# Patient Record
Sex: Male | Born: 1935 | Race: White | Hispanic: No | Marital: Married | Smoking: Never smoker
Health system: Southern US, Community
[De-identification: ages and names within clinical notes are randomized; demographics above are authoritative.]

## PROBLEM LIST (undated history)

## (undated) DIAGNOSIS — I10 Essential (primary) hypertension: Secondary | ICD-10-CM

## (undated) DIAGNOSIS — E785 Hyperlipidemia, unspecified: Secondary | ICD-10-CM

## (undated) DIAGNOSIS — E119 Type 2 diabetes mellitus without complications: Secondary | ICD-10-CM

## (undated) HISTORY — PX: CATARACT EXTRACTION: SUR2

## (undated) HISTORY — PX: PROSTATECTOMY: SHX69

---

## 2018-03-21 ENCOUNTER — Other Ambulatory Visit: Payer: Self-pay

## 2018-03-21 ENCOUNTER — Encounter (HOSPITAL_COMMUNITY): Payer: Self-pay | Admitting: Emergency Medicine

## 2018-03-21 ENCOUNTER — Emergency Department (HOSPITAL_COMMUNITY): Payer: Medicare Other

## 2018-03-21 DIAGNOSIS — Z7982 Long term (current) use of aspirin: Secondary | ICD-10-CM

## 2018-03-21 DIAGNOSIS — E785 Hyperlipidemia, unspecified: Secondary | ICD-10-CM | POA: Diagnosis present

## 2018-03-21 DIAGNOSIS — N179 Acute kidney failure, unspecified: Secondary | ICD-10-CM | POA: Diagnosis present

## 2018-03-21 DIAGNOSIS — T675XXA Heat exhaustion, unspecified, initial encounter: Secondary | ICD-10-CM | POA: Diagnosis present

## 2018-03-21 DIAGNOSIS — E860 Dehydration: Secondary | ICD-10-CM | POA: Diagnosis present

## 2018-03-21 DIAGNOSIS — Z7984 Long term (current) use of oral hypoglycemic drugs: Secondary | ICD-10-CM

## 2018-03-21 DIAGNOSIS — I509 Heart failure, unspecified: Secondary | ICD-10-CM | POA: Diagnosis not present

## 2018-03-21 DIAGNOSIS — I110 Hypertensive heart disease with heart failure: Principal | ICD-10-CM | POA: Diagnosis present

## 2018-03-21 DIAGNOSIS — E871 Hypo-osmolality and hyponatremia: Secondary | ICD-10-CM | POA: Diagnosis present

## 2018-03-21 DIAGNOSIS — D638 Anemia in other chronic diseases classified elsewhere: Secondary | ICD-10-CM | POA: Diagnosis present

## 2018-03-21 DIAGNOSIS — I429 Cardiomyopathy, unspecified: Secondary | ICD-10-CM | POA: Diagnosis present

## 2018-03-21 DIAGNOSIS — E1165 Type 2 diabetes mellitus with hyperglycemia: Secondary | ICD-10-CM | POA: Diagnosis present

## 2018-03-21 DIAGNOSIS — I493 Ventricular premature depolarization: Secondary | ICD-10-CM | POA: Diagnosis present

## 2018-03-21 DIAGNOSIS — E876 Hypokalemia: Secondary | ICD-10-CM | POA: Diagnosis present

## 2018-03-21 DIAGNOSIS — R0902 Hypoxemia: Secondary | ICD-10-CM | POA: Diagnosis present

## 2018-03-21 DIAGNOSIS — Z23 Encounter for immunization: Secondary | ICD-10-CM

## 2018-03-21 DIAGNOSIS — I5041 Acute combined systolic (congestive) and diastolic (congestive) heart failure: Secondary | ICD-10-CM | POA: Diagnosis present

## 2018-03-21 DIAGNOSIS — X30XXXA Exposure to excessive natural heat, initial encounter: Secondary | ICD-10-CM

## 2018-03-21 LAB — CBG MONITORING, ED: GLUCOSE-CAPILLARY: 217 mg/dL — AB (ref 70–99)

## 2018-03-21 NOTE — ED Triage Notes (Signed)
Pt c/o dizziness and nausea while camping with the [employer]. Pt states he did not eat lunch or dinner today due to no appetite. Pt states his blood pressure was 150/80.

## 2018-03-22 ENCOUNTER — Emergency Department (HOSPITAL_COMMUNITY): Payer: Medicare Other

## 2018-03-22 ENCOUNTER — Other Ambulatory Visit: Payer: Self-pay

## 2018-03-22 ENCOUNTER — Encounter (HOSPITAL_COMMUNITY): Payer: Self-pay | Admitting: Internal Medicine

## 2018-03-22 ENCOUNTER — Observation Stay (HOSPITAL_BASED_OUTPATIENT_CLINIC_OR_DEPARTMENT_OTHER): Payer: Medicare Other

## 2018-03-22 ENCOUNTER — Inpatient Hospital Stay (HOSPITAL_COMMUNITY)
Admission: EM | Admit: 2018-03-22 | Discharge: 2018-03-24 | DRG: 292 | Disposition: A | Discharge status: Home / Self Care | Payer: Medicare Other | Attending: Internal Medicine | Admitting: Internal Medicine

## 2018-03-22 DIAGNOSIS — T675XXA Heat exhaustion, unspecified, initial encounter: Secondary | ICD-10-CM

## 2018-03-22 DIAGNOSIS — E876 Hypokalemia: Secondary | ICD-10-CM | POA: Diagnosis present

## 2018-03-22 DIAGNOSIS — I10 Essential (primary) hypertension: Secondary | ICD-10-CM | POA: Diagnosis present

## 2018-03-22 DIAGNOSIS — E785 Hyperlipidemia, unspecified: Secondary | ICD-10-CM | POA: Diagnosis present

## 2018-03-22 DIAGNOSIS — I509 Heart failure, unspecified: Secondary | ICD-10-CM | POA: Diagnosis not present

## 2018-03-22 DIAGNOSIS — N289 Disorder of kidney and ureter, unspecified: Secondary | ICD-10-CM

## 2018-03-22 DIAGNOSIS — R0902 Hypoxemia: Secondary | ICD-10-CM

## 2018-03-22 DIAGNOSIS — E860 Dehydration: Secondary | ICD-10-CM

## 2018-03-22 DIAGNOSIS — E119 Type 2 diabetes mellitus without complications: Secondary | ICD-10-CM

## 2018-03-22 DIAGNOSIS — I5021 Acute systolic (congestive) heart failure: Secondary | ICD-10-CM | POA: Diagnosis present

## 2018-03-22 DIAGNOSIS — E871 Hypo-osmolality and hyponatremia: Secondary | ICD-10-CM | POA: Diagnosis present

## 2018-03-22 DIAGNOSIS — D649 Anemia, unspecified: Secondary | ICD-10-CM | POA: Diagnosis present

## 2018-03-22 DIAGNOSIS — I5041 Acute combined systolic (congestive) and diastolic (congestive) heart failure: Secondary | ICD-10-CM | POA: Diagnosis present

## 2018-03-22 HISTORY — DX: Essential (primary) hypertension: I10

## 2018-03-22 HISTORY — DX: Type 2 diabetes mellitus without complications: E11.9

## 2018-03-22 HISTORY — DX: Hyperlipidemia, unspecified: E78.5

## 2018-03-22 LAB — GLUCOSE, CAPILLARY
GLUCOSE-CAPILLARY: 170 mg/dL — AB (ref 70–99)
GLUCOSE-CAPILLARY: 40 mg/dL — AB (ref 70–99)
GLUCOSE-CAPILLARY: 76 mg/dL (ref 70–99)
GLUCOSE-CAPILLARY: 107 mg/dL — AB (ref 70–99)
Glucose-Capillary: 157 mg/dL — ABNORMAL HIGH (ref 70–99)
Glucose-Capillary: 134 mg/dL — ABNORMAL HIGH (ref 70–99)
Glucose-Capillary: 184 mg/dL — ABNORMAL HIGH (ref 70–99)

## 2018-03-22 LAB — CBC WITH DIFFERENTIAL/PLATELET
BASOS ABS: 0 10*3/uL (ref 0.0–0.1)
Basophils Relative: 0 %
Eosinophils Absolute: 0 10*3/uL (ref 0.0–0.7)
Eosinophils Relative: 0 %
HCT: 27.9 % — ABNORMAL LOW (ref 39.0–52.0)
Hemoglobin: 9.1 g/dL — ABNORMAL LOW (ref 13.0–17.0)
LYMPHS PCT: 12 %
Lymphs Abs: 0.8 10*3/uL (ref 0.7–4.0)
MCH: 30 pg (ref 26.0–34.0)
MCHC: 32.6 g/dL (ref 30.0–36.0)
MCV: 92.1 fL (ref 78.0–100.0)
Monocytes Absolute: 0.5 10*3/uL (ref 0.1–1.0)
Monocytes Relative: 8 %
NEUTROS ABS: 5.3 10*3/uL (ref 1.7–7.7)
Neutrophils Relative %: 80 %
PLATELETS: 165 10*3/uL (ref 150–400)
RBC: 3.03 MIL/uL — AB (ref 4.22–5.81)
RDW: 15.4 % (ref 11.5–15.5)
WBC: 6.6 10*3/uL (ref 4.0–10.5)

## 2018-03-22 LAB — URINALYSIS, ROUTINE W REFLEX MICROSCOPIC
Bilirubin Urine: NEGATIVE
GLUCOSE, UA: NEGATIVE mg/dL
Hgb urine dipstick: NEGATIVE
Ketones, ur: NEGATIVE mg/dL
LEUKOCYTES UA: NEGATIVE
NITRITE: NEGATIVE
PROTEIN: NEGATIVE mg/dL
Specific Gravity, Urine: 1.006 (ref 1.005–1.030)
pH: 5 (ref 5.0–8.0)

## 2018-03-22 LAB — BRAIN NATRIURETIC PEPTIDE: B NATRIURETIC PEPTIDE 5: 1794 pg/mL — AB (ref 0.0–100.0)

## 2018-03-22 LAB — IRON AND TIBC
Iron: 74 ug/dL (ref 45–182)
SATURATION RATIOS: 22 % (ref 17.9–39.5)
TIBC: 337 ug/dL (ref 250–450)
UIBC: 263 ug/dL

## 2018-03-22 LAB — POC OCCULT BLOOD, ED: FECAL OCCULT BLD: NEGATIVE

## 2018-03-22 LAB — CK: Total CK: 100 U/L (ref 49–397)

## 2018-03-22 LAB — COMPREHENSIVE METABOLIC PANEL
ALBUMIN: 3.8 g/dL (ref 3.5–5.0)
ALK PHOS: 68 U/L (ref 38–126)
ALT: 24 U/L (ref 0–44)
ANION GAP: 10 (ref 5–15)
AST: 30 U/L (ref 15–41)
BILIRUBIN TOTAL: 1 mg/dL (ref 0.3–1.2)
BUN: 31 mg/dL — ABNORMAL HIGH (ref 8–23)
CHLORIDE: 95 mmol/L — AB (ref 98–111)
CO2: 21 mmol/L — ABNORMAL LOW (ref 22–32)
Calcium: 8.4 mg/dL — ABNORMAL LOW (ref 8.9–10.3)
Creatinine, Ser: 1.75 mg/dL — ABNORMAL HIGH (ref 0.61–1.24)
GFR, EST AFRICAN AMERICAN: 41 mL/min — AB (ref >60)
GFR, EST NON AFRICAN AMERICAN: 35 mL/min — AB (ref >60)
Glucose, Bld: 221 mg/dL — ABNORMAL HIGH (ref 70–99)
POTASSIUM: 5.2 mmol/L — AB (ref 3.5–5.1)
Sodium: 126 mmol/L — ABNORMAL LOW (ref 135–145)
Total Protein: 6.7 g/dL (ref 6.5–8.1)

## 2018-03-22 LAB — FERRITIN: FERRITIN: 85 ng/mL (ref 24–336)

## 2018-03-22 LAB — MAGNESIUM: MAGNESIUM: 1.8 mg/dL (ref 1.7–2.4)

## 2018-03-22 LAB — TROPONIN I
Troponin I: 0.03 ng/mL (ref <0.03)
Troponin I: 0.03 ng/mL (ref <0.03)

## 2018-03-22 LAB — ECHOCARDIOGRAM COMPLETE
HEIGHTINCHES: 71 in
Weight: 3710.78 oz

## 2018-03-22 LAB — VITAMIN B12: Vitamin B-12: 1141 pg/mL — ABNORMAL HIGH (ref 180–914)

## 2018-03-22 LAB — FOLATE: FOLATE: 11.5 ng/mL (ref >5.9)

## 2018-03-22 MED ORDER — VITAMIN C 500 MG PO TABS
500.0000 mg | ORAL_TABLET | Freq: Two times a day (BID) | ORAL | Status: DC
  Administered 2018-03-22 – 2018-03-24 (×5): 500 mg via ORAL
  Filled 2018-03-22 (×5): qty 1

## 2018-03-22 MED ORDER — SODIUM CHLORIDE 0.9 % IV BOLUS
1000.0000 mL | Freq: Once | INTRAVENOUS | Status: AC
  Administered 2018-03-22: 1000 mL via INTRAVENOUS

## 2018-03-22 MED ORDER — ACETAMINOPHEN 325 MG PO TABS: 650.0000 mg | ORAL_TABLET | Freq: Four times a day (QID) | ORAL | Status: DC | PRN

## 2018-03-22 MED ORDER — VITAMIN B-12 1000 MCG PO TABS
1000.0000 ug | ORAL_TABLET | Freq: Two times a day (BID) | ORAL | Status: DC
  Administered 2018-03-22 – 2018-03-24 (×5): 1000 ug via ORAL
  Filled 2018-03-22 (×5): qty 1

## 2018-03-22 MED ORDER — MAGNESIUM SULFATE 2 GM/50ML IV SOLN
2.0000 g | Freq: Once | INTRAVENOUS | Status: AC
  Administered 2018-03-22: 2 g via INTRAVENOUS
  Filled 2018-03-22: qty 50

## 2018-03-22 MED ORDER — ORAL CARE MOUTH RINSE
15.0000 mL | Freq: Two times a day (BID) | OROMUCOSAL | Status: DC
Start: 2018-03-22 — End: 2018-03-24
  Administered 2018-03-22 – 2018-03-23 (×2): 15 mL via OROMUCOSAL

## 2018-03-22 MED ORDER — INSULIN ASPART 100 UNIT/ML ~~LOC~~ SOLN
0.0000 [IU] | Freq: Three times a day (TID) | SUBCUTANEOUS | Status: DC
  Administered 2018-03-22: 3 [IU] via SUBCUTANEOUS
  Administered 2018-03-22: 2 [IU] via SUBCUTANEOUS
  Administered 2018-03-23: 3 [IU] via SUBCUTANEOUS
  Administered 2018-03-24: 2 [IU] via SUBCUTANEOUS
  Administered 2018-03-24: 3 [IU] via SUBCUTANEOUS

## 2018-03-22 MED ORDER — PIOGLITAZONE HCL 15 MG PO TABS
15.0000 mg | ORAL_TABLET | Freq: Two times a day (BID) | ORAL | Status: DC
  Administered 2018-03-22: 15 mg via ORAL
  Filled 2018-03-22 (×4): qty 1

## 2018-03-22 MED ORDER — PNEUMOCOCCAL VAC POLYVALENT 25 MCG/0.5ML IJ INJ
0.5000 mL | INJECTION | INTRAMUSCULAR | Status: AC
  Administered 2018-03-23: 0.5 mL via INTRAMUSCULAR
  Filled 2018-03-22: qty 0.5

## 2018-03-22 MED ORDER — ATENOLOL 25 MG PO TABS
25.0000 mg | ORAL_TABLET | Freq: Every day | ORAL | Status: DC
  Administered 2018-03-22: 25 mg via ORAL
  Filled 2018-03-22 (×2): qty 1

## 2018-03-22 MED ORDER — FUROSEMIDE 10 MG/ML IJ SOLN
60.0000 mg | Freq: Once | INTRAMUSCULAR | Status: AC
  Administered 2018-03-22: 60 mg via INTRAVENOUS
  Filled 2018-03-22: qty 6

## 2018-03-22 MED ORDER — SIMVASTATIN 20 MG PO TABS
40.0000 mg | ORAL_TABLET | Freq: Every day | ORAL | Status: DC
  Administered 2018-03-22 – 2018-03-24 (×3): 40 mg via ORAL
  Filled 2018-03-22 (×3): qty 2

## 2018-03-22 MED ORDER — ONDANSETRON HCL 4 MG PO TABS: 4.0000 mg | ORAL_TABLET | Freq: Four times a day (QID) | ORAL | Status: DC | PRN

## 2018-03-22 MED ORDER — ONDANSETRON HCL 4 MG/2ML IJ SOLN: 4.0000 mg | Freq: Four times a day (QID) | INTRAMUSCULAR | Status: DC | PRN

## 2018-03-22 MED ORDER — ASPIRIN EC 81 MG PO TBEC
81.0000 mg | DELAYED_RELEASE_TABLET | Freq: Every day | ORAL | Status: DC
  Administered 2018-03-22 – 2018-03-24 (×3): 81 mg via ORAL
  Filled 2018-03-22 (×3): qty 1

## 2018-03-22 MED ORDER — METFORMIN HCL 850 MG PO TABS
850.0000 mg | ORAL_TABLET | Freq: Two times a day (BID) | ORAL | Status: DC
  Administered 2018-03-22: 850 mg via ORAL
  Filled 2018-03-22 (×3): qty 1

## 2018-03-22 MED ORDER — ACETAMINOPHEN 650 MG RE SUPP: 650.0000 mg | Freq: Four times a day (QID) | RECTAL | Status: DC | PRN

## 2018-03-22 MED ORDER — FUROSEMIDE 10 MG/ML IJ SOLN
40.0000 mg | Freq: Two times a day (BID) | INTRAMUSCULAR | Status: DC
  Administered 2018-03-22 – 2018-03-23 (×3): 40 mg via INTRAVENOUS
  Filled 2018-03-22 (×3): qty 4

## 2018-03-22 MED ORDER — LISINOPRIL 10 MG PO TABS: 20.0000 mg | ORAL_TABLET | Freq: Every day | ORAL | Status: DC

## 2018-03-22 MED ORDER — PIOGLITAZONE HCL-METFORMIN HCL 15-850 MG PO TABS: 1.0000 | ORAL_TABLET | Freq: Two times a day (BID) | ORAL | Status: DC

## 2018-03-22 MED ORDER — FERROUS SULFATE 325 (65 FE) MG PO TABS
325.0000 mg | ORAL_TABLET | Freq: Two times a day (BID) | ORAL | Status: DC
  Administered 2018-03-22 – 2018-03-24 (×5): 325 mg via ORAL
  Filled 2018-03-22 (×5): qty 1

## 2018-03-22 MED ORDER — GLIPIZIDE 5 MG PO TABS
5.0000 mg | ORAL_TABLET | Freq: Every day | ORAL | Status: DC
  Administered 2018-03-22 – 2018-03-24 (×3): 5 mg via ORAL
  Filled 2018-03-22 (×3): qty 1

## 2018-03-22 MED ORDER — ENOXAPARIN SODIUM 60 MG/0.6ML ~~LOC~~ SOLN
0.5000 mg/kg | SUBCUTANEOUS | Status: DC
  Administered 2018-03-22 – 2018-03-23 (×2): 55 mg via SUBCUTANEOUS
  Filled 2018-03-22 (×2): qty 0.6

## 2018-03-22 NOTE — ED Provider Notes (Signed)
[facility] EMERGENCY DEPARTMENT Provider Note   CSN: [id number] Arrival date & time: 03/21/18  2231  Time seen 01:25 AM   History   Chief Complaint Chief Complaint  Patient presents with  . Dizziness    HPI George Bullock is a 82 y.o. male.  HPI patient is from [place].  He states he was camping with the [place] on July 7 at [place].  They were unloading the truck and he banged his left knee and states it was sore however as the day progressed it kept getting more painful.  He states they did not have air conditioning and he was sweating a lot.  He states yesterday, July 8 he started feeling shortness of breath and having dyspnea on exertion and weakness.  This happened around lunchtime.  He states he felt dizzy and when standing felt like he might pass out.  He states he has been drinking a lot of water and he did drink one bottle Gatorade on the way to the ED.  He states he is having urinary output.  He denies headache, chest pain, or abdominal pain, vomiting, or diarrhea.  He complains of a dry mouth and states he has been having nausea and dry heaves.  He states he is never felt this way before.  PCP Patient, No Pcp Per   Past Medical History:  Diagnosis Date  . Diabetes mellitus without complication ([place])     There are no active problems to display for this patient.   Past Surgical History:  Procedure Laterality Date  . CATARACT EXTRACTION    . PROSTATECTOMY          Home Medications    Patient states he is on several medications but cannot recall what they are, he does state he is on a blood thinner  Prior to Admission medications   Medication Sig Start Date End Date Taking? Authorizing Provider  aspirin EC 81 MG tablet Take 81 mg by mouth daily.   Yes [provider]  atenolol (TENORMIN) 25 MG tablet Take by mouth daily.   Yes [provider]  ferrous sulfate 325 (65 FE) MG EC tablet Take 325 mg by mouth 2 (two) times daily.    Yes [provider]  glipiZIDE (GLUCOTROL) 5 MG tablet Take by mouth daily before breakfast.   Yes [provider]  lisinopril (PRINIVIL,ZESTRIL) 20 MG tablet Take 20 mg by mouth daily.   Yes [provider]  Omega-3 Fatty Acids (FISH OIL) 1200 MG CAPS Take by mouth 2 (two) times daily.   Yes [provider]  pioglitazone-metformin (ACTOPLUS MET) 15-850 MG tablet Take 1 tablet by mouth 2 (two) times daily with a meal.   Yes [provider]  simvastatin (ZOCOR) 40 MG tablet Take 40 mg by mouth daily.   Yes [provider]  vitamin B-12 (CYANOCOBALAMIN) 1000 MCG tablet Take 1,000 mcg by mouth 2 (two) times daily.   Yes [provider]  vitamin C (ASCORBIC ACID) 500 MG tablet Take 500 mg by mouth 2 (two) times daily.   Yes [provider]    Family History No family history on file.  Social History Social History   Tobacco Use  . Smoking status: Never Smoker  . Smokeless tobacco: Never Used  Substance Use Topics  . Alcohol use: Not Currently  . Drug use: Not Currently  retired [employer]   Allergies   Patient has no allergy information on record.   Review of  Systems Review of Systems  All other systems reviewed and are negative.    Physical Exam Updated Vital Signs BP (!) 158/73   Pulse 62   Temp 97.9 F (36.6 C) (Oral)   Resp (!) 26   Ht 5' 11"  (1.803 m)   Wt 106.6 kg (235 lb)   SpO2 96%   BMI 32.78 kg/m   Vital signs normal except hypertension   Physical Exam  Constitutional: He is oriented to person, place, and time. He appears well-developed and well-nourished.  Non-toxic appearance. He does not appear ill. No distress.  HENT:  Head: Normocephalic and atraumatic.  Right Ear: External ear normal.  Left Ear: External ear normal.  Nose: Nose normal. No mucosal edema or rhinorrhea.  Mouth/Throat: Mucous membranes are dry. No dental abscesses or uvula swelling.  Eyes: Pupils are equal, round,  and reactive to light. Conjunctivae and EOM are normal.  Neck: Normal range of motion and full passive range of motion without pain. Neck supple.  Cardiovascular: Normal rate, regular rhythm and normal heart sounds. Exam reveals no gallop and no friction rub.  No murmur heard. Pulmonary/Chest: Effort normal and breath sounds normal. No respiratory distress. He has no wheezes. He has no rhonchi. He has no rales. He exhibits no tenderness and no crepitus.  Abdominal: Soft. Normal appearance and bowel sounds are normal. He exhibits no distension. There is no tenderness. There is no rebound and no guarding.  Musculoskeletal: Normal range of motion. He exhibits edema. He exhibits no tenderness.  Moves all extremities well.  Patient has some pitting edema of his lower leg, ankles bilaterally.  There is no obvious trauma noted to his left knee, no abrasions or bruising.  He may have a small amount of swelling present.  Neurological: He is alert and oriented to person, place, and time. He has normal strength. No cranial nerve deficit.  Skin: Skin is warm, dry and intact. No rash noted. No erythema. There is pallor.  Psychiatric: He has a normal mood and affect. His speech is normal and behavior is normal. His mood appears not anxious.  Nursing note and vitals reviewed.    ED Treatments / Results  Labs (all labs ordered are listed, but only abnormal results are displayed) Results for orders placed or performed during the hospital encounter of 03/22/18  Comprehensive metabolic panel  Result Value Ref Range   Sodium 126 (L) 135 - 145 mmol/L   Potassium 5.2 (H) 3.5 - 5.1 mmol/L   Chloride 95 (L) 98 - 111 mmol/L   CO2 21 (L) 22 - 32 mmol/L   Glucose, Bld 221 (H) 70 - 99 mg/dL   BUN 31 (H) 8 - 23 mg/dL   Creatinine, Ser 1.75 (H) 0.61 - 1.24 mg/dL   Calcium 8.4 (L) 8.9 - 10.3 mg/dL   Total Protein 6.7 6.5 - 8.1 g/dL   Albumin 3.8 3.5 - 5.0 g/dL   AST 30 15 - 41 U/L   ALT 24 0 - 44 U/L   Alkaline  Phosphatase 68 38 - 126 U/L   Total Bilirubin 1.0 0.3 - 1.2 mg/dL   GFR calc non Af Amer 35 (L) >60 mL/min   GFR calc Af Amer 41 (L) >60 mL/min   Anion gap 10 5 - 15  Troponin I  Result Value Ref Range   Troponin I <0.03 <0.03 ng/mL  CBC with Differential  Result Value Ref Range   WBC 6.6 4.0 - 10.5 K/uL   RBC 3.03 (L) 4.22 -  5.81 MIL/uL   Hemoglobin 9.1 (L) 13.0 - 17.0 g/dL   HCT 27.9 (L) 39.0 - 52.0 %   MCV 92.1 78.0 - 100.0 fL   MCH 30.0 26.0 - 34.0 pg   MCHC 32.6 30.0 - 36.0 g/dL   RDW 15.4 11.5 - 15.5 %   Platelets 165 150 - 400 K/uL   Neutrophils Relative % 80 %   Neutro Abs 5.3 1.7 - 7.7 K/uL   Lymphocytes Relative 12 %   Lymphs Abs 0.8 0.7 - 4.0 K/uL   Monocytes Relative 8 %   Monocytes Absolute 0.5 0.1 - 1.0 K/uL   Eosinophils Relative 0 %   Eosinophils Absolute 0.0 0.0 - 0.7 K/uL   Basophils Relative 0 %   Basophils Absolute 0.0 0.0 - 0.1 K/uL  CK  Result Value Ref Range   Total CK 100 49 - 397 U/L  Brain natriuretic peptide  Result Value Ref Range   B Natriuretic Peptide 1,794.0 (H) 0.0 - 100.0 pg/mL  Magnesium  Result Value Ref Range   Magnesium 1.8 1.7 - 2.4 mg/dL  CBG monitoring, ED  Result Value Ref Range   Glucose-Capillary 217 (H) 70 - 99 mg/dL  POC occult blood, ED RN will collect  Result Value Ref Range   Fecal Occult Bld NEGATIVE NEGATIVE   Laboratory interpretation all normal except significant anemia although patient states he is never been told he is anemic,Hyperglycemia, hyponatremia, renal insufficiency uncertain if old or new, very elevated BNP   EKG EKG Interpretation  Date/Time:  Tuesday March 22 2018 01:49:00 EDT Ventricular Rate:  60 PR Interval:    QRS Duration: 153 QT Interval:  507 QTC Calculation: 507 R Axis:   2 Text Interpretation:  Sinus rhythm Ventricular premature complex Prolonged PR interval LVH with secondary repolarization abnormality Probable lateral infarct, age indeterminate Prolonged QT interval No old tracing to  compare Confirmed by Rolland Porter [phone]) on 03/22/2018 2:42:16 AM   EKG Interpretation  Date/Time:  Tuesday March 22 2018 02:40:10 EDT Ventricular Rate:  62 PR Interval:    QRS Duration: 121 QT Interval:  477 QTC Calculation: 485 R Axis:   3 Text Interpretation:  Sinus rhythm Multiple ventricular premature complexes LVH with secondary repolarization abnormality Borderline prolonged QT interval No significant change since last tracing 1 hour before Confirmed by Rolland Porter [phone]) on 03/22/2018 3:41:38 AM        Radiology Dg Chest Port 1 View  Result Date: 03/22/2018 CLINICAL DATA:  Sudden onset of shortness of breath.  Hypoxia. EXAM: PORTABLE CHEST 1 VIEW COMPARISON:  None. FINDINGS: Mild cardiomegaly with normal mediastinal contours. Moderate pulmonary edema. No confluent airspace disease, large pleural effusion or pneumothorax. No acute osseous abnormalities. IMPRESSION: Cardiomegaly with pulmonary edema. Electronically Signed   By: Jeb Levering M.D.   On: 03/22/2018 03:44   Dg Knee Complete 4 Views Left  Result Date: 03/21/2018 CLINICAL DATA:  Initial evaluation for acute medial and anterior left knee pain. EXAM: LEFT KNEE - COMPLETE 4+ VIEW COMPARISON:  None. FINDINGS: No acute fracture dislocation. No joint effusion. Mild degenerative osteoarthritic changes, most notable at the patellofemoral articulation. Osteopenia noted. No acute soft tissue abnormality. Vascular calcifications noted posterior to the knee. IMPRESSION: 1. No acute osseous abnormality. 2. 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 Electronically Signed   By: Jeannine Boga M.D.   On: 03/21/2018 23:45    Procedures .Critical Care Performed by: Rolland Porter, MD Authorized by: Rolland Porter, MD   Critical care provider  statement:    Critical care time (minutes):  31   Critical care was necessary to treat or prevent imminent or life-threatening deterioration of the following conditions:  Circulatory  failure   Critical care was time spent personally by me on the following activities:  Discussions with consultants, examination of patient, obtaining history from patient or surrogate, ordering and review of laboratory studies, ordering and review of radiographic studies, pulse oximetry and re-evaluation of patient's condition   (including critical care time)  Medications Ordered in ED Medications  sodium chloride 0.9 % bolus 1,000 mL (0 mLs Intravenous Stopped 03/22/18 0419)  sodium chloride 0.9 % bolus 1,000 mL (0 mLs Intravenous Stopped 03/22/18 0419)  furosemide (LASIX) injection 60 mg (60 mg Intravenous Given 03/22/18 0419)     Initial Impression / Assessment and Plan / ED Course  I have reviewed the triage vital signs and the nursing notes.  Pertinent labs & imaging results that were available during my care of the patient were reviewed by me and considered in my medical decision making (see chart for details).     I talked to the patient I felt like he was suffering some symptoms of heat exhaustion.  He was given IV fluids.  Patient states he is never been told he was anemic and Hemoccult was done which was negative.  He states he is on a unknown blood thinner.  Patient has some hyponatremia and hyperglycemia and renal insufficiency of uncertain age.  It could be new.  Patient was discussed with Dr. Olevia Bowens at 02:28 AM, he states if patient is willing he will admit him.  Recheck at 3:00 patient was reportedly more short of breath per nursing staff and his pulse ox dropped into the 80s and he was placed on oxygen.  Patient does appear to be tachypneic however when I listen to him he has some diminished breath sounds but no wheezing, rhonchi, or rales.  When I reviewed patient's chest x-ray he appears to have some increased pulmonary vascularity and his heart size is enlarged.  He was given Lasix IV despite having a renal insufficiency.  He has some peripheral edema and a BNP was added to his  blood work.   04:00 AM Dr Olevia Bowens, hospitalist, will admit  Recheck at 4:10 AM I talked to patient about his chest x-ray which looks like acute pulmonary edema and his very elevated BNP.  He had been given IV Lasix and has only had about 200 cc urinary output so far.  He states he starting to feel better.  We talked about the need for admission and he is agreeable.   Final Clinical Impressions(s) / ED Diagnoses   Final diagnoses:  Dehydration  Heat exhaustion, initial encounter  Hyponatremia  Renal insufficiency  Anemia, unspecified type  Acute congestive heart failure, unspecified heart failure type [facility])  Hypoxia    Plan admission  Rolland Porter, MD, Barbette Or, MD 03/22/18 [phone]

## 2018-03-22 NOTE — ED Notes (Signed)
POC occult negative  

## 2018-03-22 NOTE — Progress Notes (Signed)
Hypoglycemic Event  CBG: 40   Treatment: 15 GM carbohydrate snack  Symptoms: None  Follow-up CBG: Time:1622 CBG Result 76  Possible Reasons for Event: Unknown  Comments/MD notified: Patient had CBG 40 this afternoon. Notified by nurse tech. Patient given snack. Patient asymptomatic. Notified Dr. Kerry HoughMemon. No new orders received this evening. Earnstine RegalAshley Tylea Hise, RN    Riccardo DubinMcCann, Danaiya Steadman Joy

## 2018-03-22 NOTE — Care Management Note (Signed)
Case Management Note  Patient Details  Name: George Bullock MRN: [id number] Date of Birth: 03/29/1936  Subjective/Objective:                  CHF.   Action/Plan: From home. Independent. [place] Plans to return home. Acutely on oxygen, anticipate weaning to room air.   Expected Discharge Date:    03/23/2018              Expected Discharge Plan:  Home/Self Care  In-House Referral:     Discharge planning Services  CM Consult  Post Acute Care Choice:  NA Choice offered to:  NA  DME Arranged:    DME Agency:     HH Arranged:    HH Agency:     Status of Service:  Completed, signed off  If discussed at [employer] Length of Stay Meetings, dates discussed:    Additional Comments:  Tristian Bouska, Chrystine OilerSharley Diane, RN 03/22/2018, 10:38 AM

## 2018-03-22 NOTE — Care Management Obs Status (Signed)
MEDICARE OBSERVATION STATUS NOTIFICATION   Patient Details  Name: George Bullock MRN: [id number] Date of Birth: 06/12/1936   Medicare Observation Status Notification Given:  Yes    Skyy Mcknight, Chrystine OilerSharley Diane, RN 03/22/2018, 10:37 AM

## 2018-03-22 NOTE — ED Notes (Signed)
EDP in room  

## 2018-03-22 NOTE — Progress Notes (Signed)
*  PRELIMINARY RESULTS* Echocardiogram 2D Echocardiogram has been performed.  Stacey DrainWhite, Nickayla Mcinnis J 03/22/2018, 1:17 PM

## 2018-03-22 NOTE — H&P (Signed)
History and Physical    George Bullock TSV:[id number] DOB: November 11, 1935 DOA: 03/22/2018  PCP: Patient, No Pcp Per   Patient coming from: Home.  I have personally briefly reviewed patient's old medical records in [place]  Chief Complaint: Dizziness and nausea.  HPI: George Bullock is a 82 y.o. male with medical history significant of type 2 diabetes, hyperlipidemia, hypertension who is coming to the emergency department due to dizziness and nausea.  Per patient, he was camping with the [place] at [place].  While unloading a truck, he inadvertently hit his left knee and knee has been getting progressively more painful since then.  He has states that there was no air conditioning and he had profuse sweating.  Yesterday, he started feeling dyspneic, particularly while exerting and weak.  The dizziness has being intensive enough, that he felt that he could have passed out.  He has been drinking water and Gatorade to replace his water losses.  He denies fever, chills, sore throat, wheezing, hemoptysis, chest pain, palpitations, PND, orthopnea or pitting edema of the lower extremities.  He denies abdominal pain, emesis, diarrhea, constipation, melena or hematochezia.  No dysuria, hematuria, frequency or oliguria.  No heat or cold intolerance.  Denies polyuria, polydipsia or polyphagia.  Denies skin rashes.  ED Course: She will vital signs temperature 97.9 F, pulse 62, respiration 70, blood pressure 149/76 mmHg and O2 sat 96% on room air.  The patient was receiving normal saline infusion bolus and after 200 mL have been infused he became very dyspneic.  His lower extremities were very edematous, but the patient stated that he has not noticed this before.  He received furosemide 60 mg IVP and supplemental oxygen reporting relief.    His work-up shows an Unremarkable urinalysis.  CBC showed a white count of 6.6, hemoglobin 9.1 g/dL and platelets 165.Fecal occult blood was negative.  Total CK was  100.  Troponin less than 0.03 ng/mL.  His BNP was 1794.0 pg/mL.  Sodium 126, potassium 5.2, chloride 95 and CO2 21 mmol S/L.  Glucose 221, BUN 31, creatinine 1.75, calcium 8.4 and magnesium 1.8 mg/dL.  His LFTs are within normal limits.  Chest radiograph showed cardiomegaly with pulmonary edema.  Review of Systems: As per HPI otherwise 10 point review of systems negative.    Past Medical History:  Diagnosis Date  . Diabetes mellitus without complication ([place])   . Hyperlipidemia   . Hypertension     Past Surgical History:  Procedure Laterality Date  . CATARACT EXTRACTION    . PROSTATECTOMY       reports that he has never smoked. He has never used smokeless tobacco. He reports that he drank alcohol. He reports that he has current or past drug history.  No Known Allergies  Family History  Problem Relation Age of Onset  . ALS Mother     Prior to Admission medications   Medication Sig Start Date End Date Taking? Authorizing Provider  aspirin EC 81 MG tablet Take 81 mg by mouth daily.   Yes [provider]  atenolol (TENORMIN) 25 MG tablet Take by mouth daily.   Yes [provider]  ferrous sulfate 325 (65 FE) MG EC tablet Take 325 mg by mouth 2 (two) times daily.   Yes [provider]  glipiZIDE (GLUCOTROL) 5 MG tablet Take by mouth daily before breakfast.   Yes [provider]  lisinopril (PRINIVIL,ZESTRIL) 20 MG tablet Take 20 mg by mouth daily.   Yes  [provider]  Omega-3 Fatty Acids (FISH OIL) 1200 MG CAPS Take by mouth 2 (two) times daily.   Yes [provider]  pioglitazone-metformin (ACTOPLUS MET) 15-850 MG tablet Take 1 tablet by mouth 2 (two) times daily with a meal.   Yes [provider]  simvastatin (ZOCOR) 40 MG tablet Take 40 mg by mouth daily.   Yes [provider]  vitamin B-12 (CYANOCOBALAMIN) 1000 MCG tablet Take 1,000 mcg by mouth 2 (two) times daily.   Yes [provider]  vitamin C  (ASCORBIC ACID) 500 MG tablet Take 500 mg by mouth 2 (two) times daily.   Yes [provider]    Physical Exam: Vitals:   03/22/18 0400 03/22/18 0405 03/22/18 0430 03/22/18 0500  BP: (!) 152/77  (!) 156/77 (!) 161/84  Pulse:  (!) 57 (!) 58 60  Resp: (!) 21 (!) 25 (!) 25 19  Temp:      TempSrc:      SpO2:  100% 100% 100%  Weight:      Height:        Constitutional: NAD, calm, comfortable Eyes: PERRL, lids and conjunctivae normal ENMT: Mucous membranes are moist. Posterior pharynx clear of any exudate or lesions.Normal dentition.  Neck: normal, supple, no masses, no thyromegaly Respiratory: Bibasilar crackles.  No wheezing.  Normal respiratory effort. No accessory muscle use.  Cardiovascular: Regular rate and rhythm, multiple extrasystoles, no murmurs / rubs / gallops.  3+ lower extremities edema. 2+ pedal pulses. No carotid bruits.  Abdomen: Obese, soft, no tenderness, no masses palpated. No hepatosplenomegaly. Bowel sounds positive.  Musculoskeletal: no clubbing / cyanosis.  Mild edema and tenderness of left knee. Good left knee passive ROM, no contractures. Normal muscle tone.  Skin: no rashes, lesions, ulcers noted dermatological exam. Neurologic: CN 2-12 grossly intact. Sensation intact, DTR normal. Strength 5/5 in all 4.  Psychiatric: Normal judgment and insight. Alert and oriented x 3. Normal mood.    Labs on Admission: I have personally reviewed following labs and imaging studies  CBC: Recent Labs  Lab 03/22/18 0101  WBC 6.6  NEUTROABS 5.3  HGB 9.1*  HCT 27.9*  MCV 92.1  PLT 235   Basic Metabolic Panel: Recent Labs  Lab 03/22/18 0101  NA 126*  K 5.2*  CL 95*  CO2 21*  GLUCOSE 221*  BUN 31*  CREATININE 1.75*  CALCIUM 8.4*  MG 1.8   GFR: Estimated Creatinine Clearance: 42.5 mL/min (A) (by C-G formula based on SCr of 1.75 mg/dL (H)). Liver Function Tests: Recent Labs  Lab 03/22/18 0101  AST 30  ALT 24  ALKPHOS 68  BILITOT 1.0  PROT 6.7    ALBUMIN 3.8   No results for input(s): LIPASE, AMYLASE in the last 168 hours. No results for input(s): AMMONIA in the last 168 hours. Coagulation Profile: No results for input(s): INR, PROTIME in the last 168 hours. Cardiac Enzymes: Recent Labs  Lab 03/22/18 0101  CKTOTAL 100  TROPONINI <0.03   BNP (last 3 results) No results for input(s): PROBNP in the last 8760 hours. HbA1C: No results for input(s): HGBA1C in the last 72 hours. CBG: Recent Labs  Lab 03/21/18 2240 03/22/18 0555  GLUCAP 217* 170*   Lipid Profile: No results for input(s): CHOL, HDL, LDLCALC, TRIG, CHOLHDL, LDLDIRECT in the last 72 hours. Thyroid Function Tests: No results for input(s): TSH, T4TOTAL, FREET4, T3FREE, THYROIDAB in the last 72 hours. Anemia Panel: No results for input(s): VITAMINB12, FOLATE, FERRITIN, TIBC, IRON, RETICCTPCT in  the last 72 hours. Urine analysis:    Component Value Date/Time   COLORURINE STRAW (A) 03/22/2018 0530   APPEARANCEUR CLEAR 03/22/2018 0530   LABSPEC 1.006 03/22/2018 0530   PHURINE 5.0 03/22/2018 0530   GLUCOSEU NEGATIVE 03/22/2018 0530   HGBUR NEGATIVE 03/22/2018 0530   BILIRUBINUR NEGATIVE 03/22/2018 0530   KETONESUR NEGATIVE 03/22/2018 0530   PROTEINUR NEGATIVE 03/22/2018 0530   NITRITE NEGATIVE 03/22/2018 0530   LEUKOCYTESUR NEGATIVE 03/22/2018 0530    Radiological Exams on Admission: Dg Chest Port 1 View  Result Date: 03/22/2018 CLINICAL DATA:  Sudden onset of shortness of breath.  Hypoxia. EXAM: PORTABLE CHEST 1 VIEW COMPARISON:  None. FINDINGS: Mild cardiomegaly with normal mediastinal contours. Moderate pulmonary edema. No confluent airspace disease, large pleural effusion or pneumothorax. No acute osseous abnormalities. IMPRESSION: Cardiomegaly with pulmonary edema. Electronically Signed   By: Jeb Levering M.D.   On: 03/22/2018 03:44   Dg Knee Complete 4 Views Left  Result Date: 03/21/2018 CLINICAL DATA:  Initial evaluation for acute medial and  anterior left knee pain. EXAM: LEFT KNEE - COMPLETE 4+ VIEW COMPARISON:  None. FINDINGS: No acute fracture dislocation. No joint effusion. Mild degenerative osteoarthritic changes, most notable at the patellofemoral articulation. Osteopenia noted. No acute soft tissue abnormality. Vascular calcifications noted posterior to the knee. IMPRESSION: 1. No acute osseous abnormality. 2. 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 Electronically Signed   By: Jeannine Boga M.D.   On: 03/21/2018 23:45    EKG: Independently reviewed. Sinus rhythm Multiple ventricular premature complexes LVH with secondary repolarization abnormality Borderline prolonged QT interval  Assessment/Plan Principal Problem:   Acute systolic congestive heart failure (HCC) Observation/telemetry. Continue supplemental oxygen. Furosemide 40 mg IVP twice a day. Daily weights. Monitor intake and output. Check echocardiogram. Consult cardiology.  Active Problems:   Hyponatremia Secondary to hypervolemic state. Continue loop diuretic. Follow-up sodium level.    Hypokalemia With ACE inhibitor. Follow-up potassium level.    Renal insufficiency Decreased perfusion due to acute CHF? Diabetic renal disease?  Lisinopril? I will hold ACE inhibitor for now. Continue furosemide as above. Monitor renal function and electrolytes.    Hyperlipidemia Simvastatin 40 mg p.o. daily. Monitor LFTs as needed.    Diabetes mellitus without complication (HCC) Carb modified diet. Continue glipizide 5 mg p.o. daily. CBG monitoring with regular insulin sliding scale.    Hypertension Hold ACE inhibitor. Resume beta-blocker later today, since the patient is doing better clinically.    Anemia Monitor hematocrit and hemoglobin. Check anemia panel.    DVT prophylaxis: Lovenox SQ. Code Status: Full code. Family Communication: Disposition Plan: Observation for CHF exacerbation evaluation and further  treatment. Consults called: Routine cardiology consult. Admission status:/Telemetry/observation.   Reubin Milan MD [facility] Pager [phone].  If 7PM-7AM, please contact night-coverage [email or web address] Password [email or web address]  03/22/2018, 6:02 AM

## 2018-03-22 NOTE — ED Notes (Signed)
Jonathon ResidesJohn Sly [phone] [facility] Center[facility] Roman [phone]

## 2018-03-22 NOTE — Progress Notes (Signed)
Patient admitted to the hospital earlier this morning by Dr. Robb Matarrtiz.  Patient seen and examined.  Reports that shortness of breath is mildly better since admission.  He does not notice a difference in his lower extremity edema.  No chest pain.  On exam, he has crackles at bases bilaterally.  1-2+ edema noted bilaterally.  Patient admitted to the hospital with shortness of breath and lower extremity edema.  BNP is markedly elevated.  He is being treated for decompensated CHF.  He is responding well to intravenous Lasix.  Echocardiogram has been done with report pending.  Continue current treatments.  Darden RestaurantsJehanzeb Wetzel Bullock

## 2018-03-22 NOTE — ED Notes (Signed)
Called into pt room by visitors saying that patient has sudden onset SOB. Pt says he is unable to take a deep breath. o2 sat 89% on room air.  2l [place] placed as a result o2 sat increased to 96%. EDP notified. Repeat EKG performed and given to EDP.

## 2018-03-23 DIAGNOSIS — Z7982 Long term (current) use of aspirin: Secondary | ICD-10-CM | POA: Diagnosis not present

## 2018-03-23 DIAGNOSIS — E1165 Type 2 diabetes mellitus with hyperglycemia: Secondary | ICD-10-CM | POA: Diagnosis present

## 2018-03-23 DIAGNOSIS — X30XXXA Exposure to excessive natural heat, initial encounter: Secondary | ICD-10-CM | POA: Diagnosis not present

## 2018-03-23 DIAGNOSIS — I509 Heart failure, unspecified: Secondary | ICD-10-CM | POA: Diagnosis present

## 2018-03-23 DIAGNOSIS — I493 Ventricular premature depolarization: Secondary | ICD-10-CM | POA: Diagnosis present

## 2018-03-23 DIAGNOSIS — I5021 Acute systolic (congestive) heart failure: Secondary | ICD-10-CM | POA: Diagnosis present

## 2018-03-23 DIAGNOSIS — I110 Hypertensive heart disease with heart failure: Secondary | ICD-10-CM | POA: Diagnosis present

## 2018-03-23 DIAGNOSIS — E860 Dehydration: Secondary | ICD-10-CM | POA: Diagnosis present

## 2018-03-23 DIAGNOSIS — I10 Essential (primary) hypertension: Secondary | ICD-10-CM | POA: Diagnosis not present

## 2018-03-23 DIAGNOSIS — E785 Hyperlipidemia, unspecified: Secondary | ICD-10-CM | POA: Diagnosis present

## 2018-03-23 DIAGNOSIS — T675XXA Heat exhaustion, unspecified, initial encounter: Secondary | ICD-10-CM | POA: Diagnosis present

## 2018-03-23 DIAGNOSIS — D649 Anemia, unspecified: Secondary | ICD-10-CM

## 2018-03-23 DIAGNOSIS — E871 Hypo-osmolality and hyponatremia: Secondary | ICD-10-CM

## 2018-03-23 DIAGNOSIS — N179 Acute kidney failure, unspecified: Secondary | ICD-10-CM | POA: Diagnosis present

## 2018-03-23 DIAGNOSIS — I5041 Acute combined systolic (congestive) and diastolic (congestive) heart failure: Secondary | ICD-10-CM | POA: Diagnosis present

## 2018-03-23 DIAGNOSIS — E876 Hypokalemia: Secondary | ICD-10-CM | POA: Diagnosis present

## 2018-03-23 DIAGNOSIS — Z23 Encounter for immunization: Secondary | ICD-10-CM | POA: Diagnosis present

## 2018-03-23 DIAGNOSIS — I429 Cardiomyopathy, unspecified: Secondary | ICD-10-CM | POA: Diagnosis present

## 2018-03-23 DIAGNOSIS — E119 Type 2 diabetes mellitus without complications: Secondary | ICD-10-CM | POA: Diagnosis not present

## 2018-03-23 DIAGNOSIS — R0902 Hypoxemia: Secondary | ICD-10-CM | POA: Diagnosis present

## 2018-03-23 DIAGNOSIS — Z7984 Long term (current) use of oral hypoglycemic drugs: Secondary | ICD-10-CM | POA: Diagnosis not present

## 2018-03-23 DIAGNOSIS — D638 Anemia in other chronic diseases classified elsewhere: Secondary | ICD-10-CM | POA: Diagnosis present

## 2018-03-23 LAB — CBC
HEMATOCRIT: 27 % — AB (ref 39.0–52.0)
HEMOGLOBIN: 8.8 g/dL — AB (ref 13.0–17.0)
MCH: 29.9 pg (ref 26.0–34.0)
MCHC: 32.6 g/dL (ref 30.0–36.0)
MCV: 91.8 fL (ref 78.0–100.0)
Platelets: 164 10*3/uL (ref 150–400)
RBC: 2.94 MIL/uL — ABNORMAL LOW (ref 4.22–5.81)
RDW: 15.5 % (ref 11.5–15.5)
WBC: 4.5 10*3/uL (ref 4.0–10.5)

## 2018-03-23 LAB — GLUCOSE, CAPILLARY
GLUCOSE-CAPILLARY: 102 mg/dL — AB (ref 70–99)
GLUCOSE-CAPILLARY: 189 mg/dL — AB (ref 70–99)
Glucose-Capillary: 100 mg/dL — ABNORMAL HIGH (ref 70–99)
Glucose-Capillary: 84 mg/dL (ref 70–99)

## 2018-03-23 LAB — BASIC METABOLIC PANEL
ANION GAP: 9 (ref 5–15)
BUN: 32 mg/dL — ABNORMAL HIGH (ref 8–23)
CO2: 27 mmol/L (ref 22–32)
Calcium: 8.6 mg/dL — ABNORMAL LOW (ref 8.9–10.3)
Chloride: 99 mmol/L (ref 98–111)
Creatinine, Ser: 1.52 mg/dL — ABNORMAL HIGH (ref 0.61–1.24)
GFR calc Af Amer: 48 mL/min — ABNORMAL LOW (ref >60)
GFR calc non Af Amer: 42 mL/min — ABNORMAL LOW (ref >60)
Glucose, Bld: 90 mg/dL (ref 70–99)
POTASSIUM: 4.1 mmol/L (ref 3.5–5.1)
Sodium: 135 mmol/L (ref 135–145)

## 2018-03-23 LAB — RETICULOCYTES
RBC.: 2.92 MIL/uL — ABNORMAL LOW (ref 4.22–5.81)
Retic Count, Absolute: 46.7 10*3/uL (ref 19.0–186.0)
Retic Ct Pct: 1.6 % (ref 0.4–3.1)

## 2018-03-23 LAB — TSH: TSH: 1.579 u[IU]/mL (ref 0.350–4.500)

## 2018-03-23 MED ORDER — ENOXAPARIN SODIUM 60 MG/0.6ML ~~LOC~~ SOLN
50.0000 mg | SUBCUTANEOUS | Status: DC
  Administered 2018-03-24: 50 mg via SUBCUTANEOUS
  Filled 2018-03-23: qty 0.6

## 2018-03-23 MED ORDER — METOPROLOL SUCCINATE ER 25 MG PO TB24
12.5000 mg | ORAL_TABLET | Freq: Every day | ORAL | Status: DC
  Administered 2018-03-23 – 2018-03-24 (×2): 12.5 mg via ORAL
  Filled 2018-03-23 (×2): qty 1

## 2018-03-23 NOTE — Progress Notes (Signed)
**Note De-identified Marquavius Scaife Obfuscation** EKG complete RN notified of results and placed in patient chart 

## 2018-03-23 NOTE — Progress Notes (Signed)
PROGRESS NOTE    George Bullock  WUJ:[id number] DOB: 06/27/1936 DOA: 03/22/2018 PCP: Patient, No Pcp Per    Brief Narrative:  [age] year old male presents to the hospital with dizziness and nausea.  Found to have significant peripheral edema and chest x-ray indicated decompensated CHF.  He initially received IV fluids in the emergency room, but became very dyspneic.  He is been started on intravenous diuretics.  Cardiology following.   Assessment & Plan:   Principal Problem:   Acute systolic congestive heart failure (HCC) Active Problems:   Hyperlipidemia   Diabetes mellitus without complication (HCC)   Hypertension   Anemia   Hyponatremia   Hypokalemia   Renal insufficiency   Acute combined systolic and diastolic heart failure (HCC)   Acute systolic (congestive) heart failure (HCC)   1. Acute combined CHF.  Ejection fraction of 40 to 45%.  Currently on intravenous diuretics.  He is had aggressive diuresis.  Overall volume status is improving.  Net volume status since admission is -9.5 L.  Cardiology following.  He is on beta-blockers.  Not a candidate for ACE inhibitors due to renal dysfunction.   2. Hypokalemia.  Improved with replacement 3. Hyponatremia.  Secondary to hypervolemia.  Improved with diuresis. 4. Acute kidney injury.  Related to low output.  Renal function improving with diuresis.  Continue to monitor for now 5. Diabetes.  Currently on glipizide and sliding scale insulin.  Blood sugars stable. 6. Hyperlipidemia.  Continue statin 7. Normocytic anemia.  Anemia panel indicates chronic disease.  No evidence of bleeding at this time.  This can be further worked up as an outpatient.  Hemoglobin appears to be stable.   DVT prophylaxis: Lovenox Code Status: Full code Family Communication: No family present Disposition Plan: Discharge home once improved   Consultants:   Cardiology  Procedures:  Echo:- Left ventricle: The cavity size was normal. Wall thickness was  increased in a pattern of mild LVH. Systolic function was mildly   to moderately reduced. The estimated ejection fraction was in the   range of 40% to 45%. Diffuse hypokinesis. Features are consistent   with a pseudonormal left ventricular filling pattern, with   concomitant abnormal relaxation and increased filling pressure   (grade 2 diastolic dysfunction). Doppler parameters are   consistent with high ventricular filling pressure. - Aortic valve: Valve area (VTI): 1.9 cm^2. Valve area (Vmax): 2.28   cm^2. Valve area (Vmean): 2.03 cm^2. - Mitral valve: Mildly to moderately calcified annulus. Mildly   thickened leaflets . - Left atrium: The atrium was moderately dilated. - Right atrium: The atrium was mildly dilated. - Atrial septum: No defect or patent foramen ovale was identified. - Pulmonary arteries: Systolic pressure was moderately increased.   PA peak pressure: 50 mm Hg (S).  - Technically adequate study.  Antimicrobials:      Subjective: He is feeling better today.  Feels shortness of breath is improving.  No chest pain.  Objective: Vitals:   03/23/18 0559 03/23/18 0600 03/23/18 1211 03/23/18 1445  BP: (!) 119/58   (S) (!) 120/42  Pulse: (!) 53   61  Resp: 18   18  Temp: 98.2 F (36.8 C)   98.2 F (36.8 C)  TempSrc: Oral   Oral  SpO2: 100%  97% 97%  Weight:  99.4 kg (219 lb 2.2 oz)    Height:        Intake/Output Summary (Last 24 hours) at 03/23/2018 1853 Last data filed at 03/23/2018 1756 Gross per 24  hour  Intake 960 ml  Output 7750 ml  Net -6790 ml   Filed Weights   03/21/18 2240 03/22/18 0603 03/23/18 0600  Weight: 106.6 kg (235 lb) 105.2 kg (231 lb 14.8 oz) 99.4 kg (219 lb 2.2 oz)    Examination:  General exam: Appears calm and comfortable  Respiratory system: Crackles at bases. Respiratory effort normal. Cardiovascular system: S1 & S2 heard, RRR. No JVD, murmurs, rubs, gallops or clicks.  1+ pedal edema. Gastrointestinal system: Abdomen is  nondistended, soft and nontender. No organomegaly or masses felt. Normal bowel sounds heard. Central nervous system: Alert and oriented. No focal neurological deficits. Extremities: Symmetric 5 x 5 power. Skin: No rashes, lesions or ulcers Psychiatry: Judgement and insight appear normal. Mood & affect appropriate.     Data Reviewed: I have personally reviewed following labs and imaging studies  CBC: Recent Labs  Lab 03/22/18 0101 03/23/18 0429  WBC 6.6 4.5  NEUTROABS 5.3  --   HGB 9.1* 8.8*  HCT 27.9* 27.0*  MCV 92.1 91.8  PLT 165 164   Basic Metabolic Panel: Recent Labs  Lab 03/22/18 0101 03/23/18 0429  NA 126* 135  K 5.2* 4.1  CL 95* 99  CO2 21* 27  GLUCOSE 221* 90  BUN 31* 32*  CREATININE 1.75* 1.52*  CALCIUM 8.4* 8.6*  MG 1.8  --    GFR: Estimated Creatinine Clearance: 47.3 mL/min (A) (by C-G formula based on SCr of 1.52 mg/dL (H)). Liver Function Tests: Recent Labs  Lab 03/22/18 0101  AST 30  ALT 24  ALKPHOS 68  BILITOT 1.0  PROT 6.7  ALBUMIN 3.8   No results for input(s): LIPASE, AMYLASE in the last 168 hours. No results for input(s): AMMONIA in the last 168 hours. Coagulation Profile: No results for input(s): INR, PROTIME in the last 168 hours. Cardiac Enzymes: Recent Labs  Lab 03/22/18 0101 03/22/18 0629 03/22/18 1319  CKTOTAL 100  --   --   TROPONINI <0.03 0.03* 0.03*   BNP (last 3 results) No results for input(s): PROBNP in the last 8760 hours. HbA1C: No results for input(s): HGBA1C in the last 72 hours. CBG: Recent Labs  Lab 03/22/18 2143 03/23/18 0014 03/23/18 0735 03/23/18 1117 03/23/18 1610  GLUCAP 107* 84 102* 189* 100*   Lipid Profile: No results for input(s): CHOL, HDL, LDLCALC, TRIG, CHOLHDL, LDLDIRECT in the last 72 hours. Thyroid Function Tests: Recent Labs    03/22/18 0429  TSH 1.579   Anemia Panel: Recent Labs    03/22/18 0629 03/23/18 0429  VITAMINB12 1,141*  --   FOLATE 11.5  --   FERRITIN 85  --     TIBC 337  --   IRON 74  --   RETICCTPCT  --  1.6   Sepsis Labs: No results for input(s): PROCALCITON, LATICACIDVEN in the last 168 hours.  No results found for this or any previous visit (from the past 240 hour(s)).       Radiology Studies: Dg Chest Port 1 View  Result Date: 03/22/2018 CLINICAL DATA:  Sudden onset of shortness of breath.  Hypoxia. EXAM: PORTABLE CHEST 1 VIEW COMPARISON:  None. FINDINGS: Mild cardiomegaly with normal mediastinal contours. Moderate pulmonary edema. No confluent airspace disease, large pleural effusion or pneumothorax. No acute osseous abnormalities. IMPRESSION: Cardiomegaly with pulmonary edema. Electronically Signed   By: Rubye Oaks M.D.   On: 03/22/2018 03:44   Dg Knee Complete 4 Views Left  Result Date: 03/21/2018 CLINICAL DATA:  Initial evaluation for acute  medial and anterior left knee pain. EXAM: LEFT KNEE - COMPLETE 4+ VIEW COMPARISON:  None. FINDINGS: No acute fracture dislocation. No joint effusion. Mild degenerative osteoarthritic changes, most notable at the patellofemoral articulation. Osteopenia noted. No acute soft tissue abnormality. Vascular calcifications noted posterior to the knee. IMPRESSION: 1. No acute osseous abnormality. 2. 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 Electronically Signed   By: Rise Mu M.D.   On: 03/21/2018 23:45        Scheduled Meds: . aspirin EC  81 mg Oral Daily  . [START ON 03/24/2018] enoxaparin (LOVENOX) injection  50 mg Subcutaneous Q24H  . ferrous sulfate  325 mg Oral BID  . glipiZIDE  5 mg Oral QAC breakfast  . insulin aspart  0-15 Units Subcutaneous TID WC  . mouth rinse  15 mL Mouth Rinse BID  . metoprolol succinate  12.5 mg Oral Daily  . simvastatin  40 mg Oral Daily  . vitamin B-12  1,000 mcg Oral BID  . vitamin C  500 mg Oral BID   Continuous Infusions:   LOS: 0 days    Time spent: 30 minutes    Erick Blinks, MD [facility] Pager  [phone]  If 7PM-7AM, please contact night-coverage [email or web address] Password [facility] 03/23/2018, 6:53 PM

## 2018-03-23 NOTE — Consult Note (Addendum)
Cardiology Consult    Patient ID: George Bullock; [id number]; [birth year]   Admit date: 03/22/2018 Date of Consult: 03/23/2018  Primary Care Provider: Patient, No Pcp Per Primary Cardiologist: New to [facility] - Dr. Harl Bowie (Patient is from [place], [place])  Patient Profile    George Bullock is a 82 y.o. male with past medical history of Type 2 DM, HTN, and HLD who is being seen today for the evaluation of acute systolic CHF/ new cardiomyopathy at the request of Dr. Roderic Palau.   History of Present Illness    [name] presented to [facility] ED on 03/21/2018 for evaluation of dizziness, nausea, and worsening dyspnea over the past several days. He is here in [place] with a [employer] camp and reports having dyspnea and diaphoresis the day prior to admission which he initially thought was due to the lack of air conditioner at the camp. He reports the weeks leading up to his trip, he was able to perform his routine activities without any chest pain or dyspnea on exertion. 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 He lives an active lifestyle and was previously going to the gym 4 to 5 days/week without any anginal symptoms. Denies any recent orthopnea, PND, or palpitations. Does have a history of lower extremity edema. Says him and his wife dine out multiple times per week.   He is unaware of any known history of CAD or prior CHF. Reports having an echocardiogram and stress test 10+ years ago which were normal by his report. No family history of CAD. He denies any tobacco use or recreational drug use.  Initial labs show WBC 6.6, Hgb 9.1, platelets 165, Na+ 126, K+ 5.2, and creatinine 1.75.  Initial troponin less than 0.03 with repeat values flat at 0.03. Mg 1.8. BNP elevated to 1794. CXR showing cardiomegaly with pulmonary edema. EKG showed NSR, HR 60, with PVC's and TWI along inferior and lateral leads (no prior tracings available for comparison).   He was initially started on IV fluids due to  concern for dehydration but developed acute dyspnea with this while in the ED. Therefore he was admitted and started on IV Lasix 40 mg twice daily.  Repeat labs have been obtained this morning and hemoglobin is down to 8.8 and BMET is pending (K+ has resulted to 4.1 but other electrolytes and creatinine are pending). Listed as having an overall output of -5.7L this admission and weight having declined by 12 lbs (231 --> 219 lbs).   An echocardiogram was performed on 03/22/2018 and showed the EF was reduced to 40 to 45%, diffuse HK, grade 2 diastolic dysfunction, mild LVH, and no significant valve abnormalities.   Past Medical History:  Diagnosis Date  . Diabetes mellitus without complication ([place])   . Hyperlipidemia   . Hypertension     Past Surgical History:  Procedure Laterality Date  . CATARACT EXTRACTION    . PROSTATECTOMY       Home Medications:  Prior to Admission medications   Medication Sig Start Date End Date Taking? Authorizing Provider  aspirin EC 81 MG tablet Take 81 mg by mouth daily.   Yes [provider]  atenolol (TENORMIN) 50 MG tablet Take 1 tablet by mouth daily. 01/04/18  Yes [provider]  ferrous sulfate 325 (65 FE) MG EC tablet Take 325 mg by mouth 2 (two) times daily.   Yes [provider]  glipiZIDE (GLUCOTROL) 5 MG tablet Take by mouth  daily before breakfast.   Yes [provider]  lisinopril (PRINIVIL,ZESTRIL) 20 MG tablet Take 20 mg by mouth daily.   Yes [provider]  Omega-3 Fatty Acids (FISH OIL) 1200 MG CAPS Take by mouth 2 (two) times daily.   Yes [provider]  pioglitazone-metformin (ACTOPLUS MET) 15-850 MG tablet Take 1 tablet by mouth 2 (two) times daily with a meal.   Yes [provider]  simvastatin (ZOCOR) 40 MG tablet Take 40 mg by mouth daily.   Yes [provider]  vitamin B-12 (CYANOCOBALAMIN) 1000 MCG tablet Take 1,000 mcg by mouth 2 (two) times daily.   Yes [provider]  vitamin C (ASCORBIC ACID) 500 MG tablet Take 500 mg by mouth 2 (two) times daily.   Yes [provider]    Inpatient Medications: Scheduled Meds: . aspirin EC  81 mg Oral Daily  . atenolol  25 mg Oral Daily  . enoxaparin (LOVENOX) injection  0.5 mg/kg Subcutaneous Q24H  . ferrous sulfate  325 mg Oral BID  . furosemide  40 mg Intravenous BID  . glipiZIDE  5 mg Oral QAC breakfast  . insulin aspart  0-15 Units Subcutaneous TID WC  . mouth rinse  15 mL Mouth Rinse BID  . pneumococcal 23 valent vaccine  0.5 mL Intramuscular Tomorrow-1000  . simvastatin  40 mg Oral Daily  . vitamin B-12  1,000 mcg Oral BID  . vitamin C  500 mg Oral BID   Continuous Infusions:  PRN Meds: acetaminophen **OR** acetaminophen, ondansetron **OR** ondansetron (ZOFRAN) IV  Allergies:   No Known Allergies  Social History:   Social History   Socioeconomic History  . Marital status: Married    Spouse name: Not on file  . Number of children: Not on file  . Years of education: Not on file  . Highest education level: Not on file  Occupational History  . Not on file  Social Needs  . Financial resource strain: Not on file  . Food insecurity:    Worry: Not on file    Inability: Not on file  . Transportation needs:    Medical: Not on file    Non-medical: Not on file  Tobacco Use  . Smoking status: Never Smoker  . Smokeless tobacco: Never Used  Substance and Sexual Activity  . Alcohol use: Not Currently  . Drug use: Not Currently  . Sexual activity: Not on file  Lifestyle  . Physical activity:    Days per week: Not on file    Minutes per session: Not on file  . Stress: Not on file  Relationships  . Social connections:    Talks on phone: Not on file    Gets together: Not on file    Attends religious service: Not on file    Active member of club or organization: Not on file    Attends meetings of clubs or organizations: Not on file    Relationship status: Not on file  .  Intimate partner violence:    Fear of current or ex partner: Not on file    Emotionally abused: Not on file    Physically abused: Not on file    Forced sexual activity: Not on file  Other Topics Concern  . Not on file  Social History Narrative  . Not on file     Family History:    Family History  Problem Relation Age of Onset  . ALS Mother  Review of Systems    General:  No chills, fever, night sweats or weight changes.  Cardiovascular:  No chest pain, orthopnea, palpitations, paroxysmal nocturnal dyspnea. Positive for dyspnea and edema.  Dermatological: No rash, lesions/masses Respiratory: No cough, dyspnea Urologic: No hematuria, dysuria Abdominal:   No nausea, vomiting, diarrhea, bright red blood per rectum, melena, or hematemesis Neurologic:  No visual changes, wkns, changes in mental status. All other systems reviewed and are otherwise negative except as noted above.  Physical Exam/Data    Vitals:   03/22/18 1924 03/22/18 2148 03/23/18 0559 03/23/18 0600  BP:  (!) 111/42 (!) 119/58   Pulse:  (!) 52 (!) 53   Resp:  18 18   Temp:  98.4 F (36.9 C) 98.2 F (36.8 C)   TempSrc:  Oral Oral   SpO2: 98% 100% 100%   Weight:    219 lb 2.2 oz (99.4 kg)  Height:        Intake/Output Summary (Last 24 hours) at 03/23/2018 0737 Last data filed at 03/23/2018 9528 Gross per 24 hour  Intake 1080 ml  Output 6900 ml  Net -5820 ml   Filed Weights   03/21/18 2240 03/22/18 0603 03/23/18 0600  Weight: 235 lb (106.6 kg) 231 lb 14.8 oz (105.2 kg) 219 lb 2.2 oz (99.4 kg)   Body mass index is 30.56 kg/m.   General: Pleasant, elderly Caucasian male appearing in NAD Psych: Normal affect. Neuro: Alert and oriented X 3. Moves all extremities spontaneously. HEENT: Normal  Neck: Supple without bruits or JVD. Lungs:  Resp regular and unlabored, CTA without wheezing or rales. Heart: RRR no s3, s4, or murmurs. Abdomen: Soft, non-tender, non-distended, BS + x 4.  Extremities: No  clubbing or cyanosis. 2+ pitting edema up to mid-shins bilaterally. DP/PT/Radials 2+ and equal bilaterally.   EKG:  The EKG was personally reviewed and demonstrates: NSR, HR 60, with PVC's and TWI along inferior and lateral leads (no prior tracings available for comparison).    Telemetry:  Telemetry was personally reviewed and demonstrates: Sinus bradycardia, HR in mid-50's to 60's. Occasional PVC's.    Labs/Studies     Relevant CV Studies:  Echocardiogram: 03/22/2018 Study Conclusions  - Left ventricle: The cavity size was normal. Wall thickness was   increased in a pattern of mild LVH. Systolic function was mildly   to moderately reduced. The estimated ejection fraction was in the   range of 40% to 45%. Diffuse hypokinesis. Features are consistent   with a pseudonormal left ventricular filling pattern, with   concomitant abnormal relaxation and increased filling pressure   (grade 2 diastolic dysfunction). Doppler parameters are   consistent with high ventricular filling pressure. - Aortic valve: Valve area (VTI): 1.9 cm^2. Valve area (Vmax): 2.28   cm^2. Valve area (Vmean): 2.03 cm^2. - Mitral valve: Mildly to moderately calcified annulus. Mildly   thickened leaflets . - Left atrium: The atrium was moderately dilated. - Right atrium: The atrium was mildly dilated. - Atrial septum: No defect or patent foramen ovale was identified. - Pulmonary arteries: Systolic pressure was moderately increased.   PA peak pressure: 50 mm Hg (S). - Technically adequate study.  Laboratory Data:  Chemistry Recent Labs  Lab 03/22/18 0101 03/23/18 0429  NA 126* PENDING  K 5.2* 4.1  CL 95* PENDING  CO2 21* 27  GLUCOSE 221* PENDING  BUN 31* PENDING  CREATININE 1.75* PENDING  CALCIUM 8.4* 8.6*  GFRNONAA 35* PENDING  GFRAA 41* PENDING  ANIONGAP 10 PENDING  Recent Labs  Lab 03/22/18 0101  PROT 6.7  ALBUMIN 3.8  AST 30  ALT 24  ALKPHOS 68  BILITOT 1.0   Hematology Recent Labs    Lab 03/22/18 0101 03/23/18 0429  WBC 6.6 4.5  RBC 3.03* 2.94*  2.92*  HGB 9.1* 8.8*  HCT 27.9* 27.0*  MCV 92.1 91.8  MCH 30.0 29.9  MCHC 32.6 32.6  RDW 15.4 15.5  PLT 165 164   Cardiac Enzymes Recent Labs  Lab 03/22/18 0101 03/22/18 0629 03/22/18 1319  TROPONINI <0.03 0.03* 0.03*   No results for input(s): TROPIPOC in the last 168 hours.  BNP Recent Labs  Lab 03/22/18 0101  BNP 1,794.0*    DDimer No results for input(s): DDIMER in the last 168 hours.  Radiology/Studies:  Dg Chest Port 1 View  Result Date: 03/22/2018 CLINICAL DATA:  Sudden onset of shortness of breath.  Hypoxia. EXAM: PORTABLE CHEST 1 VIEW COMPARISON:  None. FINDINGS: Mild cardiomegaly with normal mediastinal contours. Moderate pulmonary edema. No confluent airspace disease, large pleural effusion or pneumothorax. No acute osseous abnormalities. IMPRESSION: Cardiomegaly with pulmonary edema. Electronically Signed   By: Jeb Levering M.D.   On: 03/22/2018 03:44   Dg Knee Complete 4 Views Left  Result Date: 03/21/2018 CLINICAL DATA:  Initial evaluation for acute medial and anterior left knee pain. EXAM: LEFT KNEE - COMPLETE 4+ VIEW COMPARISON:  None. FINDINGS: No acute fracture dislocation. No joint effusion. Mild degenerative osteoarthritic changes, most notable at the patellofemoral articulation. Osteopenia noted. No acute soft tissue abnormality. Vascular calcifications noted posterior to the knee. IMPRESSION: 1. No acute osseous abnormality. 2. 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 Electronically Signed   By: Jeannine Boga M.D.   On: 03/21/2018 23:45     Assessment & Plan    1. Acute Combined Systolic and Diastolic CHF - presented for evaluation of worsening dyspnea, nausea, and dizziness --> found to have an acute CHF exacerbation with BNP elevated to 1794 and CXR showing pulmonary edema. Echocardiogram shows EF is reduced to 40 to 45%, with diffuse HK, grade 2 diastolic  dysfunction, mild LVH, and no significant valve abnormalities. He has been started on IV Lasix '40mg'$  BID and is overall -5.7L and weight has declined by 12 lbs (231 --> 219 lbs). Reports baseline weight in the 220's at home. He still has edema on examination, therefore would continue with IV Lasix at this time and anticipate transitioning to PO later today or early tomorrow morning. - he denies any recent anginal symptoms and has no known history of CAD or CHF. His initial EKG did show T-wave abnormalities in the setting of multiple electrolyte derangements, therefore will obtain a repeat tracing now that these have normalized. Given his AKI and anemia, would not pursue a catheterization at this time. Would focus on medical therapy and plan for a repeat echo in 2-3 months. He lives in [place], [place] and will need to establish care with a Cardiologist in the area. Will stop Atenolol and switch to Toprol-XL 12.'5mg'$  daily (can titrate as HR allows but this has been in the mid-50's). Plan to restart ACE-I once kidney function normalizes.   2. HTN - BP has been well-controlled since admission, at 119/58 on most recent check. - will stop Atenolol and switch to Toprol-XL as outlined above. Lisinopril currently held due to his AKI.  3. HLD - no recent FLP on file. Continue Simvastatin '40mg'$  daily.  4. Type 2 DM - glucose at 102 this AM. Remains  on Glipizide 66m daily.   5. AKI - Creatinine elevated to 1.75 on admission with no prior values available for comparison. Repeat BMET has not yet been resulted this morning. I called the lab and they will follow-up on this as only some results are transferring over.   6. Normocytic Anemia - Hgb 9.1 on admission, at 8.8 this AM. He denies any evidence of active bleeding. Was on iron supplementation prior to admission. - further evaluation per admitting team.     For questions or updates, please contact CRiver BendPlease consult [email or web address] for contact info  under Cardiology/STEMI.  Signed, BErma Heritage PA-C 03/23/2018, 7:37 AM Pager: [phone] Attending note Patient seen and discussed with PA SAhmed Prima I agree with her documentation above. 82yo male history of DM2, HL, HTN admitted with dizziness and nausea and SOB. Has had some LE edema.  In ER treated with IVFs for dehydration with significant worsening of his breathin  Na 126 K 5.2 BUN 31 Cr 1.75 (unknown baseline) WBC 6.6 Hgb 9.1 BNP 1794 Mg 1.8  Trop neg-->0.03-->0.03--> CXR pulm edema EKG SR, inferior and lateral precordial ST depreesions/TWIs probabel LVH strain pattern Echo LVEF 40-45%, grade II diastolic dysfunction, mild LVH. PASP 50. Diffuse hypokinesis   Patient presents with acute combined systolic/diastolic HF. Negative 5.8 liters yesterday. His hyponatremia has resolved, renal function improving with diuresis consistnent with CHF and venous congestion. Yesterday he received IV lasix, 60, 40,40. No on IV 441mbid. Very aggressive IV diuresis yesterday, would dose IV lasix just once today.  Atenolol has been changed to Toprol XL 12.66m58maily. No ACE/ARB/ARNI/aldactone due to poor renal function. Soft bp's at times, would not start hydral/nitrates at this time. Mild flat tropin setting of CHF and AKI not consistent with ACS, I think his EKG ST/T changes are related to LVH and strain, though we don't have a prior EKG for comparison  Monitor today with further diuresis, follow vitals and renal function. Remains volume overloaded by exam. Possible discharge tomorrow on oral diuretics. Will need to establish with outpatient cardiology at his home to consider possible ischemic testing.   Hyponatremia has resolved. Renal function is improving.  Anemia per primary team  JonCarlyle Dolly

## 2018-03-23 NOTE — Plan of Care (Signed)
Nutrition Education Note  RD consulted for nutrition education regarding onset CHF.  RD provided "Heart Failure " handout from the Academy of [employer]. Reviewed patient's dietary recall. His wife is a vegetarian and he eats what she prepares. Patient occasionally eats chicken when they eat out. He does not add salt at table but does consume a few high sodium foods such as cheese and dill pickles. He and spouse both read food labels and we reviewed the guidelines for low and high sodium ranges and provided examples for each. His eating pattern is consistently 3 meals daily and snacks.  They eat little to no processed foods. Encouraged continued fresh fruits and vegetables as well as whole grain sources of carbohydrates to maximize fiber intake.   RD discussed why it is important for patient to adhere to diet recommendations, and emphasized the role of fluids, foods to avoid, and importance of weighing self daily. Teach back method used. Expect good compliance.  Body mass index is 30.56 kg/m. Pt meets criteria for obese based on current BMI.  Current diet order is Heart Healthy, patient reports consuming approximately >75% of meals at this time. Labs and medications reviewed. No further nutrition interventions warranted at this time. RD contact information provided. If additional nutrition issues arise, please re-consult RD.   Goals: Patient will weigh himself daily every morning after emptying bladder. He will limit weekly servings of cheese and pickles.  Royann ShiversLynn Jazzy Parmer MS,RD,CSG,LDN Office: [phone] Pager: [phone]

## 2018-03-24 LAB — BASIC METABOLIC PANEL
ANION GAP: 10 (ref 5–15)
BUN: 28 mg/dL — ABNORMAL HIGH (ref 8–23)
CALCIUM: 8.9 mg/dL (ref 8.9–10.3)
CO2: 28 mmol/L (ref 22–32)
Chloride: 100 mmol/L (ref 98–111)
Creatinine, Ser: 1.38 mg/dL — ABNORMAL HIGH (ref 0.61–1.24)
GFR, EST AFRICAN AMERICAN: 55 mL/min — AB (ref >60)
GFR, EST NON AFRICAN AMERICAN: 47 mL/min — AB (ref >60)
Glucose, Bld: 127 mg/dL — ABNORMAL HIGH (ref 70–99)
Potassium: 4.1 mmol/L (ref 3.5–5.1)
Sodium: 138 mmol/L (ref 135–145)

## 2018-03-24 LAB — GLUCOSE, CAPILLARY
GLUCOSE-CAPILLARY: 116 mg/dL — AB (ref 70–99)
Glucose-Capillary: 137 mg/dL — ABNORMAL HIGH (ref 70–99)
Glucose-Capillary: 166 mg/dL — ABNORMAL HIGH (ref 70–99)

## 2018-03-24 MED ORDER — LISINOPRIL 2.5 MG PO TABS: 2.5000 mg | ORAL_TABLET | Freq: Every day | ORAL | 0 refills | Status: AC

## 2018-03-24 MED ORDER — FUROSEMIDE 20 MG PO TABS
20.0000 mg | ORAL_TABLET | Freq: Every day | ORAL | Status: DC
  Administered 2018-03-24: 20 mg via ORAL
  Filled 2018-03-24: qty 1

## 2018-03-24 MED ORDER — LISINOPRIL 5 MG PO TABS
2.5000 mg | ORAL_TABLET | Freq: Every day | ORAL | Status: DC
  Administered 2018-03-24: 2.5 mg via ORAL
  Filled 2018-03-24: qty 1

## 2018-03-24 MED ORDER — ENOXAPARIN SODIUM 40 MG/0.4ML ~~LOC~~ SOLN: 40.0000 mg | SUBCUTANEOUS | Status: DC

## 2018-03-24 MED ORDER — METOPROLOL SUCCINATE ER 25 MG PO TB24: 12.5000 mg | ORAL_TABLET | Freq: Every day | ORAL | 0 refills | Status: AC

## 2018-03-24 MED ORDER — FUROSEMIDE 20 MG PO TABS: 20.0000 mg | ORAL_TABLET | Freq: Every day | ORAL | 1 refills | Status: AC

## 2018-03-24 NOTE — Progress Notes (Signed)
Patient is to be discharged home and in stable condition. Patient's IV and telemetry removed, WNL. Patient given discharge instructions and verbalized understanding. Patient to be escorted out by staff via wheelchair when ready.  Gyasi Hazzard P Dishmon, RN  

## 2018-03-24 NOTE — Progress Notes (Addendum)
Progress Note  Patient Name: George Bullock Date of Encounter: 03/24/2018  Primary Cardiologist: New to [facility] - Dr. Wyline Mood (Patient is from [place], [place])   Subjective   Reports breathing is back to baseline. No chest pain or palpitations. Anxious to go home.   Inpatient Medications    Scheduled Meds: . aspirin EC  81 mg Oral Daily  . enoxaparin (LOVENOX) injection  50 mg Subcutaneous Q24H  . ferrous sulfate  325 mg Oral BID  . glipiZIDE  5 mg Oral QAC breakfast  . insulin aspart  0-15 Units Subcutaneous TID WC  . mouth rinse  15 mL Mouth Rinse BID  . metoprolol succinate  12.5 mg Oral Daily  . simvastatin  40 mg Oral Daily  . vitamin B-12  1,000 mcg Oral BID  . vitamin C  500 mg Oral BID   Continuous Infusions:  PRN Meds: acetaminophen **OR** acetaminophen, ondansetron **OR** ondansetron (ZOFRAN) IV   Vital Signs    Vitals:   03/23/18 1211 03/23/18 1445 03/23/18 2100 03/24/18 0525  BP:  (S) (!) 120/42 (!) 116/56 (!) 137/57  Pulse:  61 60 67  Resp:  18 18 18   Temp:  98.2 F (36.8 C) 98.3 F (36.8 C) 98.8 F (37.1 C)  TempSrc:  Oral Oral Oral  SpO2: 97% 97% 97% 97%  Weight:    213 lb 3 oz (96.7 kg)  Height:        Intake/Output Summary (Last 24 hours) at 03/24/2018 0732 Last data filed at 03/24/2018 0634 Gross per 24 hour  Intake 720 ml  Output 7100 ml  Net -6380 ml   Filed Weights   03/22/18 0603 03/23/18 0600 03/24/18 0525  Weight: 231 lb 14.8 oz (105.2 kg) 219 lb 2.2 oz (99.4 kg) 213 lb 3 oz (96.7 kg)    Telemetry    NSR, HR in 60's to 70's with occasional PVC's.  - Personally Reviewed  ECG    Sinus bradycardia, HR 55, with 1st degree AV block, LVH and nonspecific IVCD. - Personally Reviewed  Physical Exam   General: Well developed, well nourished Caucasian male appearing in no acute distress. Head: Normocephalic, atraumatic.  Neck: Supple without bruits, JVD not elevated. Lungs:  Resp regular and unlabored, CTA without wheezing or  rales. Heart: RRR, S1, S2, no S3, S4, or murmur; no rub. Abdomen: Soft, non-tender, non-distended with normoactive bowel sounds. No hepatomegaly. No rebound/guarding. No obvious abdominal masses. Extremities: No clubbing or cyanosis, trace lower extremity edema. Distal pedal pulses are 2+ bilaterally. Neuro: Alert and oriented X 3. Moves all extremities spontaneously. Psych: Normal affect.  Labs    Chemistry Recent Labs  Lab 03/22/18 0101 03/23/18 0429 03/24/18 0505  NA 126* 135 138  K 5.2* 4.1 4.1  CL 95* 99 100  CO2 21* 27 28  GLUCOSE 221* 90 127*  BUN 31* 32* 28*  CREATININE 1.75* 1.52* 1.38*  CALCIUM 8.4* 8.6* 8.9  PROT 6.7  --   --   ALBUMIN 3.8  --   --   AST 30  --   --   ALT 24  --   --   ALKPHOS 68  --   --   BILITOT 1.0  --   --   GFRNONAA 35* 42* 47*  GFRAA 41* 48* 55*  ANIONGAP 10 9 10      Hematology Recent Labs  Lab 03/22/18 0101 03/23/18 0429  WBC 6.6 4.5  RBC 3.03* 2.94*  2.92*  HGB 9.1* 8.8*  HCT 27.9* 27.0*  MCV 92.1 91.8  MCH 30.0 29.9  MCHC 32.6 32.6  RDW 15.4 15.5  PLT 165 164    Cardiac Enzymes Recent Labs  Lab 03/22/18 0101 03/22/18 0629 03/22/18 1319  TROPONINI <0.03 0.03* 0.03*   No results for input(s): TROPIPOC in the last 168 hours.   BNP Recent Labs  Lab 03/22/18 0101  BNP 1,794.0*     DDimer No results for input(s): DDIMER in the last 168 hours.   Radiology    No results found.  Cardiac Studies   Echocardiogram: 03/22/2018 Study Conclusions  - Left ventricle: The cavity size was normal. Wall thickness was   increased in a pattern of mild LVH. Systolic function was mildly   to moderately reduced. The estimated ejection fraction was in the   range of 40% to 45%. Diffuse hypokinesis. Features are consistent   with a pseudonormal left ventricular filling pattern, with   concomitant abnormal relaxation and increased filling pressure   (grade 2 diastolic dysfunction). Doppler parameters are   consistent with  high ventricular filling pressure. - Aortic valve: Valve area (VTI): 1.9 cm^2. Valve area (Vmax): 2.28   cm^2. Valve area (Vmean): 2.03 cm^2. - Mitral valve: Mildly to moderately calcified annulus. Mildly   thickened leaflets . - Left atrium: The atrium was moderately dilated. - Right atrium: The atrium was mildly dilated. - Atrial septum: No defect or patent foramen ovale was identified. - Pulmonary arteries: Systolic pressure was moderately increased.   PA peak pressure: 50 mm Hg (S). - Technically adequate study.  Patient Profile     82 y.o. male w/ PMH of Type 2 DM, HTN, and HLD who presented to [facility] ED on 03/22/2018 for evaluation of worsening dyspnea, nausea, and dizziness. Found to have an acute CHF exacerbation with echo showing a presumed newly reduced EF of 40-45%.   Assessment & Plan    1. Acute Combined Systolic and Diastolic CHF - presented for evaluation of worsening dyspnea, nausea, and dizziness --> found to have an acute CHF exacerbation with BNP elevated to 1794 and CXR showing pulmonary edema. Echocardiogram shows EF is reduced to 40 to 45%, with diffuse HK, grade 2 diastolic dysfunction, mild LVH, and no significant valve abnormalities. Has been diuresed with IV Lasix 40mg  BID and is -12.1L this admission and weight has declined by 18 lbs (231 --> 213 lbs). Reports baseline weight in the 220's at home. Will switch to PO Lasix today (will review with Dr. Wyline MoodBranch to see if he prefers for him to be on 20mg  daily or 40mg  daily). Reviewed sodium and fluid restriction with the patient along with importance of daily weights.  - he denies any recent anginal symptoms and has no known history of CAD or CHF. Given his AKI and anemia, no plans for a catheterization at this time. Will need to establish with a Cardi[place] in [place] for a repeat echo in 2-3 months and further ischemic testing at that time if indicated. He has been started on Toprol-XL 12.5mg  daily. Not on  ACE-I/ARB/ARNI due to AKI origianlly, now will start low dose lisinopril.   2. HTN - BP has soft at 116/42 - 137/57 within the past 24 hours.  - Atenolol has been transitioned to Toprol-XL in the setting of his cardiomyopathy. - restart lisinopril at lower dose 2.5mg  daily.   3. HLD - no recent FLP on file. Continue Simvastatin 40mg  daily.  4. Type 2 DM - Remains on PTA Glipizide 5mg   daily.   5. AKI - Creatinine elevated to 1.75 on admission with no prior values available for comparison. Improved to 1.38 with diuresis.   6. Normocytic Anemia - Hgb 9.1 on admission, at 8.8 on 7/10. He denies any evidence of active bleeding.  - further evaluation per admitting team.     Attending note Patient seen and discussed with PA Strader, I agree with her documentation. Admitted with acute combined systolic/diastolic HF. Very robust response to IV lasix even with currint dosing yesterday. Negative 6.4 L yesterday, negative 12.1 liters since admission. His Cr actually trended down with this degree of diuresis consistent with CHF and venous congestion. Agree with changing to oral lasix 20mg  daily. Other medical therapy with Toprol 12.5, we will start lisinopril 2.5mg  daily. No urgent indication for ischemic evaluation, can be considered as outpatient. Ok for discharge, he will arrange close f/u with pcp who will need to arrange cardiology follow up in his hometown in [place].   Dina Rich MD    [facility] will sign off.   Medication Recommendations: Continue ASA, statin, Lasix and Toprol-XL, Lisinopril as outlined above. Stop Atenolol Other recommendations (labs, testing, etc): CBC and BMET in 1 week. Follow up as an outpatient: 1-week PCP follow-up; 2-week follow-up with Cardiologist in [place], [place].   For questions or updates, please contact [facility] Please consult [email or web address] for contact info under Cardiology/STEMI.        Signed, Ellsworth Lennox , PA-C 7:32  AM 03/24/2018 Pager: [phone]

## 2018-03-24 NOTE — Discharge Summary (Signed)
Physician Discharge Summary  George Bullock BZJ:[id number] DOB: 28-Jun-1936 DOA: 03/22/2018  PCP: Patient, No Pcp Per  Admit date: 03/22/2018 Discharge date: 03/24/2018  Admitted From: Home Disposition: Home  Recommendations for Outpatient Follow-up:  1. Follow up with PCP in 1-2 weeks 2. Please obtain BMP/CBC in one week 3. Patient to follow-up with outpatient cardiologist in [facility]  Discharge Condition: Stable CODE STATUS: Full code Diet recommendation: Heart healthy, carb modified  Brief/Interim Summary: 82 year old male initially presented to the hospital with dizziness and nausea.  He was found to have significant peripheral edema and chest x-ray indicated decompensated CHF.  He was admitted to the hospital for intravenous diuretics.  Discharge Diagnoses:  Principal Problem:   Acute combined systolic and diastolic heart failure (HCC) Active Problems:   Hyperlipidemia   Diabetes mellitus without complication (HCC)   Hypertension   Anemia   Hyponatremia   Hypokalemia   Renal insufficiency  1. Acute combined systolic and diastolic CHF.  Ejection fraction of 40 to 45%.  Patient was treated with intravenous diuresis and had brisk diuresis.  Net volume status is -12 L on discharge.  He is feeling significantly better.  Feels that breathing is back to baseline.  He is been transitioned to oral Lasix.  He is on beta-blockers and ACE inhibitors.  Recommendations are to follow-up with a cardiologist in [facility] where the patient is from. 2. Hypokalemia.  Improved 3. Hyponatremia.  Secondary to hypervolemia.  Resolved with diuresis. 4. Acute kidney injury.  Baseline creatinine is unclear.  Overall creatinine did improve with diuresis.  Suspect this is related to low output. 5. Normocytic anemia.  Anemia panel indicates chronic disease.  No evidence of bleeding at this time.  This can be further worked up as an outpatient.  Hemoglobin has been stable. 6. Diabetes.   Metformin/pioglitazone has been discontinued due to patient's renal function as well as underlying CHF.  He is continued on glipizide.  Blood sugars have been stable.  Follow-up with primary care physician. 7. Hyperlipidemia.  Continue statin  Discharge Instructions  Discharge Instructions    Diet - low sodium heart healthy   Complete by:  As directed    Increase activity slowly   Complete by:  As directed      Allergies as of 03/24/2018   No Known Allergies     Medication List    STOP taking these medications   atenolol 50 MG tablet Commonly known as:  TENORMIN   pioglitazone-metformin 15-850 MG tablet Commonly known as:  ACTOPLUS MET     TAKE these medications   aspirin EC 81 MG tablet Take 81 mg by mouth daily.   ferrous sulfate 325 (65 FE) MG EC tablet Take 325 mg by mouth 2 (two) times daily.   Fish Oil 1200 MG Caps Take by mouth 2 (two) times daily.   furosemide 20 MG tablet Commonly known as:  LASIX Take 1 tablet (20 mg total) by mouth daily. Start taking on:  03/25/2018   glipiZIDE 5 MG tablet Commonly known as:  GLUCOTROL Take by mouth daily before breakfast.   lisinopril 2.5 MG tablet Commonly known as:  PRINIVIL,ZESTRIL Take 1 tablet (2.5 mg total) by mouth daily. Start taking on:  03/25/2018 What changed:    medication strength  how much to take   metoprolol succinate 25 MG 24 hr tablet Commonly known as:  TOPROL-XL Take 0.5 tablets (12.5 mg total) by mouth daily. Start taking on:  03/25/2018   simvastatin 40  MG tablet Commonly known as:  ZOCOR Take 40 mg by mouth daily.   vitamin B-12 1000 MCG tablet Commonly known as:  CYANOCOBALAMIN Take 1,000 mcg by mouth 2 (two) times daily.   vitamin C 500 MG tablet Commonly known as:  ASCORBIC ACID Take 500 mg by mouth 2 (two) times daily.       No Known Allergies  Consultations:  Cardiology   Procedures/Studies: Dg Chest Port 1 View  Result Date: 03/22/2018 CLINICAL DATA:  Sudden  onset of shortness of breath.  Hypoxia. EXAM: PORTABLE CHEST 1 VIEW COMPARISON:  None. FINDINGS: Mild cardiomegaly with normal mediastinal contours. Moderate pulmonary edema. No confluent airspace disease, large pleural effusion or pneumothorax. No acute osseous abnormalities. IMPRESSION: Cardiomegaly with pulmonary edema. Electronically Signed   By: Jeb Levering M.D.   On: 03/22/2018 03:44   Dg Knee Complete 4 Views Left  Result Date: 03/21/2018 CLINICAL DATA:  Initial evaluation for acute medial and anterior left knee pain. EXAM: LEFT KNEE - COMPLETE 4+ VIEW COMPARISON:  None. FINDINGS: No acute fracture dislocation. No joint effusion. Mild degenerative osteoarthritic changes, most notable at the patellofemoral articulation. Osteopenia noted. No acute soft tissue abnormality. Vascular calcifications noted posterior to the knee. IMPRESSION: 1. No acute osseous abnormality. 2. 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 Electronically Signed   By: Jeannine Boga M.D.   On: 03/21/2018 23:45       Subjective: Feeling better.  Feels breathing is back to baseline.  No chest pain  Discharge Exam: Vitals:   03/23/18 2100 03/24/18 0525  BP: (!) 116/56 (!) 137/57  Pulse: 60 67  Resp: 18 18  Temp: 98.3 F (36.8 C) 98.8 F (37.1 C)  SpO2: 97% 97%   Vitals:   03/23/18 1211 03/23/18 1445 03/23/18 2100 03/24/18 0525  BP:  (S) (!) 120/42 (!) 116/56 (!) 137/57  Pulse:  61 60 67  Resp:  _0 Temp:  98.2 F (36.8 C) 98.3 F (36.8 C) 98.8 F (37.1 C)  TempSrc:  Oral Oral Oral  SpO2: 97% 97% 97% 97%  Weight:    96.7 kg (213 lb 3 oz)  Height:        General: Pt is alert, awake, not in acute distress Cardiovascular: RRR, S1/S2 +, no rubs, no gallops Respiratory: CTA bilaterally, no wheezing, no rhonchi Abdominal: Soft, NT, ND, bowel sounds + Extremities: Trace edema, no cyanosis    The results of significant diagnostics from this hospitalization (including  imaging, microbiology, ancillary and laboratory) are listed below for reference.     Microbiology: No results found for this or any previous visit (from the past 240 hour(s)).   Labs: BNP (last 3 results) Recent Labs    03/22/18 0101  BNP 7,062.3*   Basic Metabolic Panel: Recent Labs  Lab 03/22/18 0101 03/23/18 0429 03/24/18 0505  NA 126* 135 138  K 5.2* 4.1 4.1  CL 95* 99 100  CO2 21* 27 28  GLUCOSE 221* 90 127*  BUN 31* 32* 28*  CREATININE 1.75* 1.52* 1.38*  CALCIUM 8.4* 8.6* 8.9  MG 1.8  --   --    Liver Function Tests: Recent Labs  Lab 03/22/18 0101  AST 30  ALT 24  ALKPHOS 68  BILITOT 1.0  PROT 6.7  ALBUMIN 3.8   No results for input(s): LIPASE, AMYLASE in the last 168 hours. No results for input(s): AMMONIA in the last 168 hours. CBC: Recent Labs  Lab 03/22/18 0101 03/23/18 0429  WBC  6.6 4.5  NEUTROABS 5.3  --   HGB 9.1* 8.8*  HCT 27.9* 27.0*  MCV 92.1 91.8  PLT 165 164   Cardiac Enzymes: Recent Labs  Lab 03/22/18 0101 03/22/18 0629 03/22/18 1319  CKTOTAL 100  --   --   TROPONINI <0.03 0.03* 0.03*   BNP: Invalid input(s): POCBNP CBG: Recent Labs  Lab 03/23/18 1117 03/23/18 1610 03/23/18 2056 03/24/18 0757 03/24/18 1131  GLUCAP 189* 100* 116* 137* 166*   D-Dimer No results for input(s): DDIMER in the last 72 hours. Hgb A1c No results for input(s): HGBA1C in the last 72 hours. Lipid Profile No results for input(s): CHOL, HDL, LDLCALC, TRIG, CHOLHDL, LDLDIRECT in the last 72 hours. Thyroid function studies Recent Labs    03/22/18 0429  TSH 1.579   Anemia work up Recent Labs    03/22/18 0629 03/23/18 0429  VITAMINB12 1,141*  --   FOLATE 11.5  --   FERRITIN 85  --   TIBC 337  --   IRON 74  --   RETICCTPCT  --  1.6   Urinalysis    Component Value Date/Time   COLORURINE STRAW (A) 03/22/2018 0530   APPEARANCEUR CLEAR 03/22/2018 0530   LABSPEC 1.006 03/22/2018 0530   PHURINE 5.0 03/22/2018 0530   GLUCOSEU NEGATIVE  03/22/2018 0530   HGBUR NEGATIVE 03/22/2018 0530   BILIRUBINUR NEGATIVE 03/22/2018 0530   KETONESUR NEGATIVE 03/22/2018 0530   PROTEINUR NEGATIVE 03/22/2018 0530   NITRITE NEGATIVE 03/22/2018 0530   LEUKOCYTESUR NEGATIVE 03/22/2018 0530   Sepsis Labs Invalid input(s): PROCALCITONIN,  WBC,  LACTICIDVEN Microbiology No results found for this or any previous visit (from the past 240 hour(s)).   Time coordinating discharge: 17mns  SIGNED:   JKathie Dike MD  [facility] 03/24/2018, 2:10 PM Pager   If 7PM-7AM, please contact night-coverage [email or web address] Password [email or web address]

## 2019-05-30 IMAGING — DX DG KNEE COMPLETE 4+V*L*
4 series · 4 of 4 positions shown · non-contrast
Comparison: None.

CLINICAL DATA: Initial evaluation for acute medial and anterior
left knee pain.

EXAM:
LEFT KNEE - COMPLETE 4+ VIEW

[knee ap (1 of 3)]
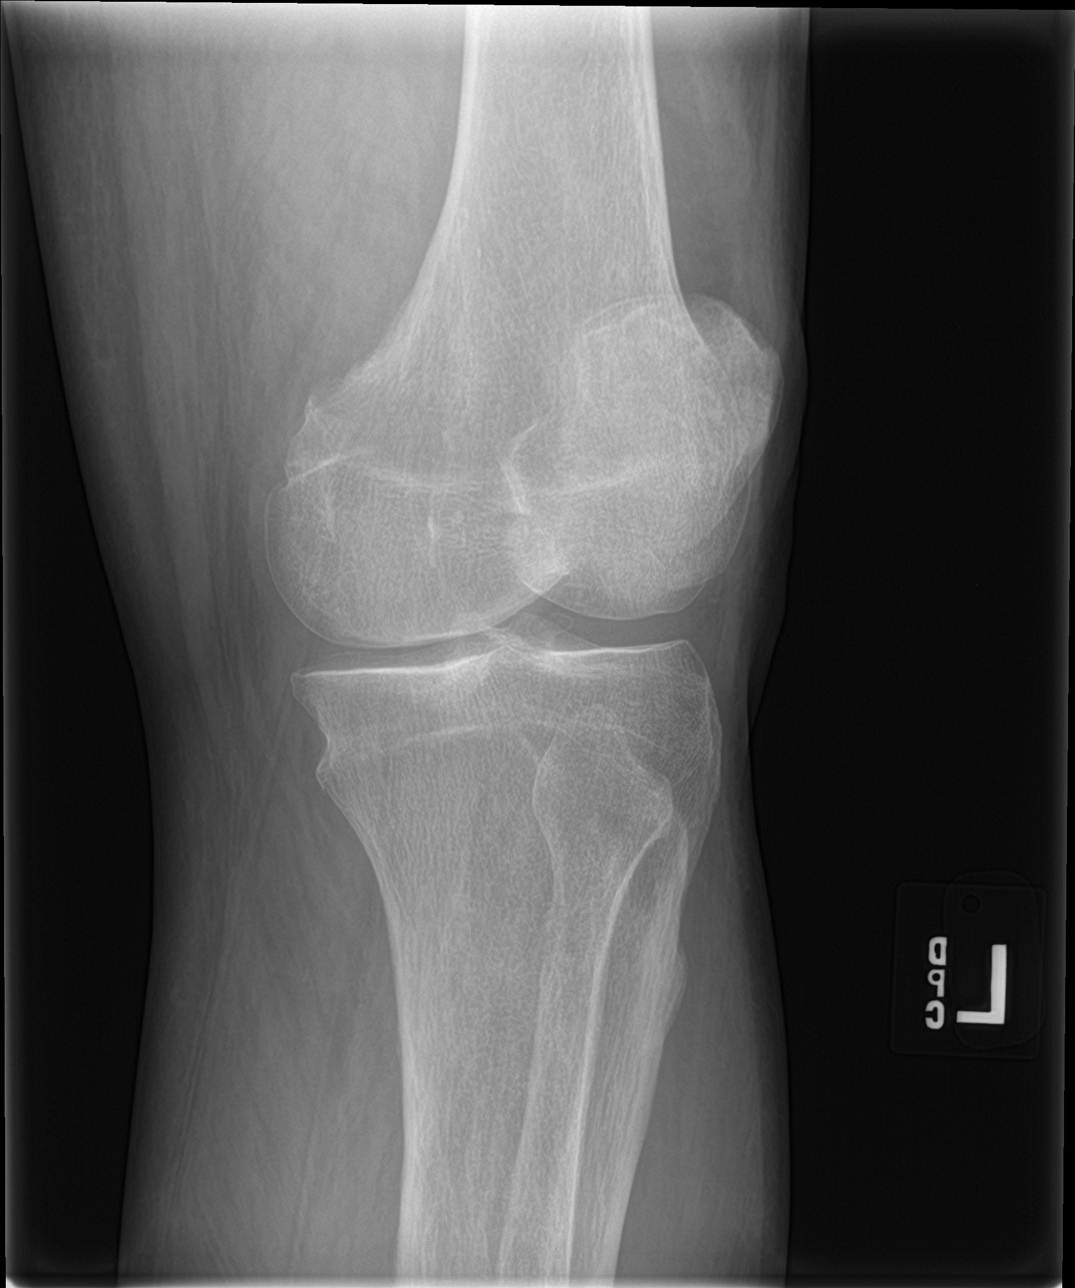

[knee lat]
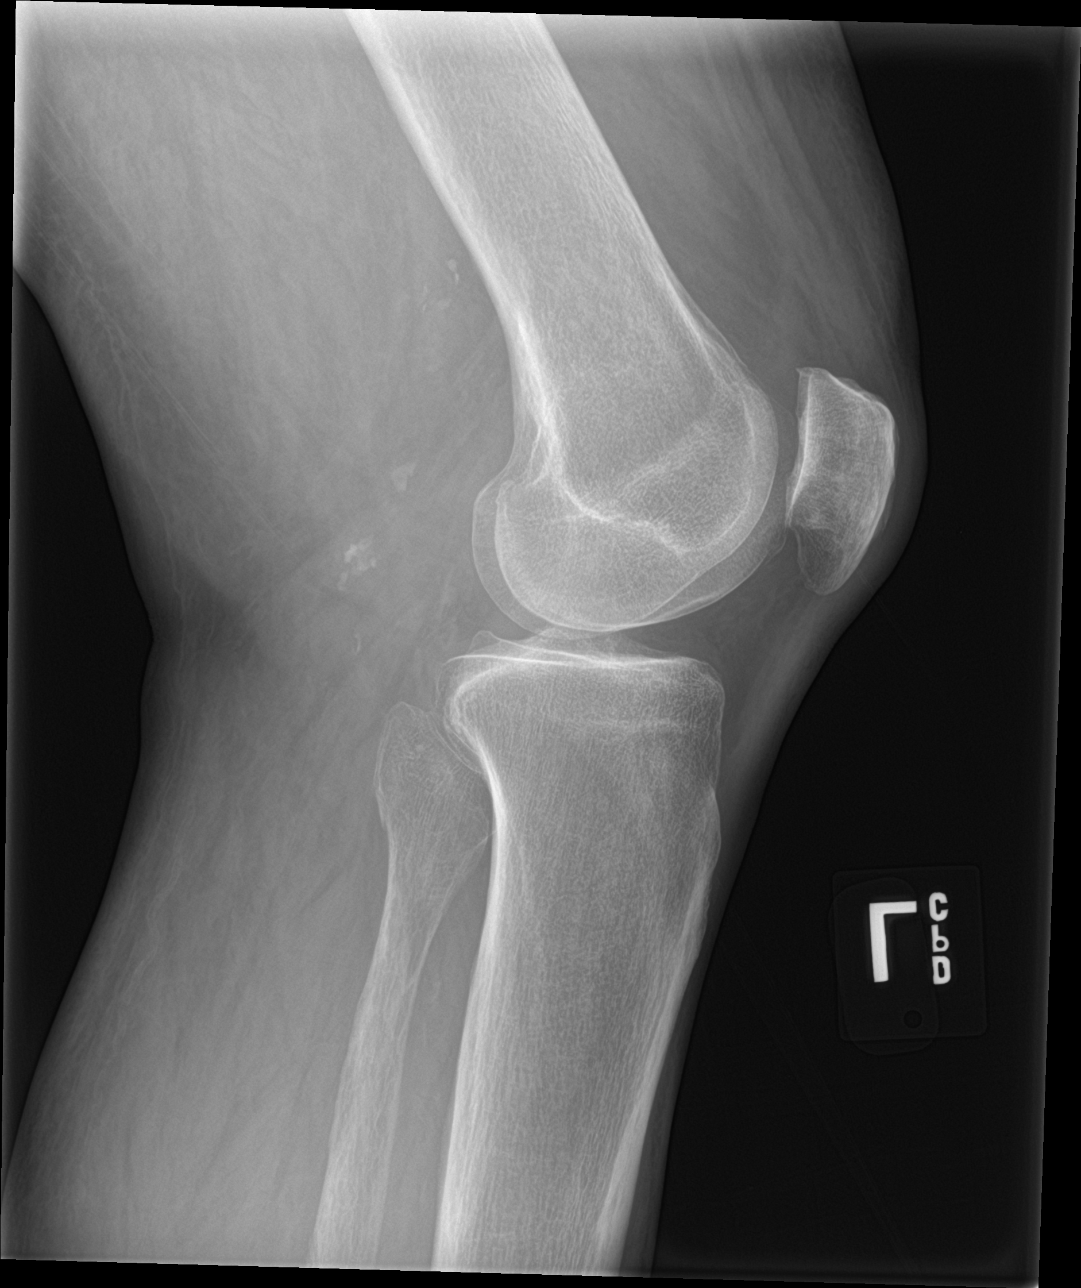

[knee ap (2 of 3)]
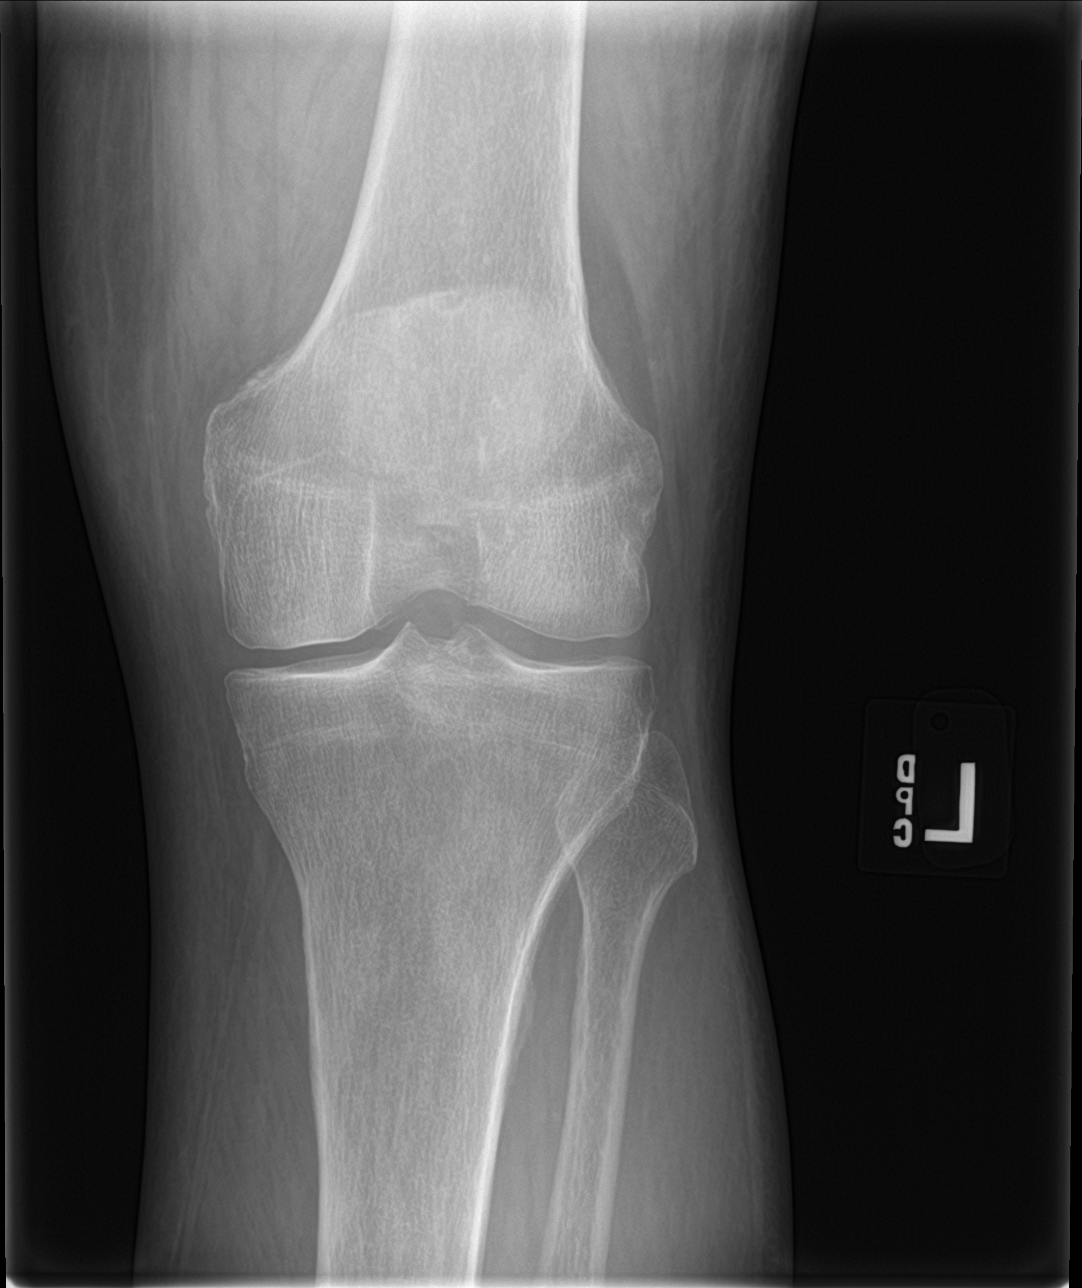

[knee ap (3 of 3)]
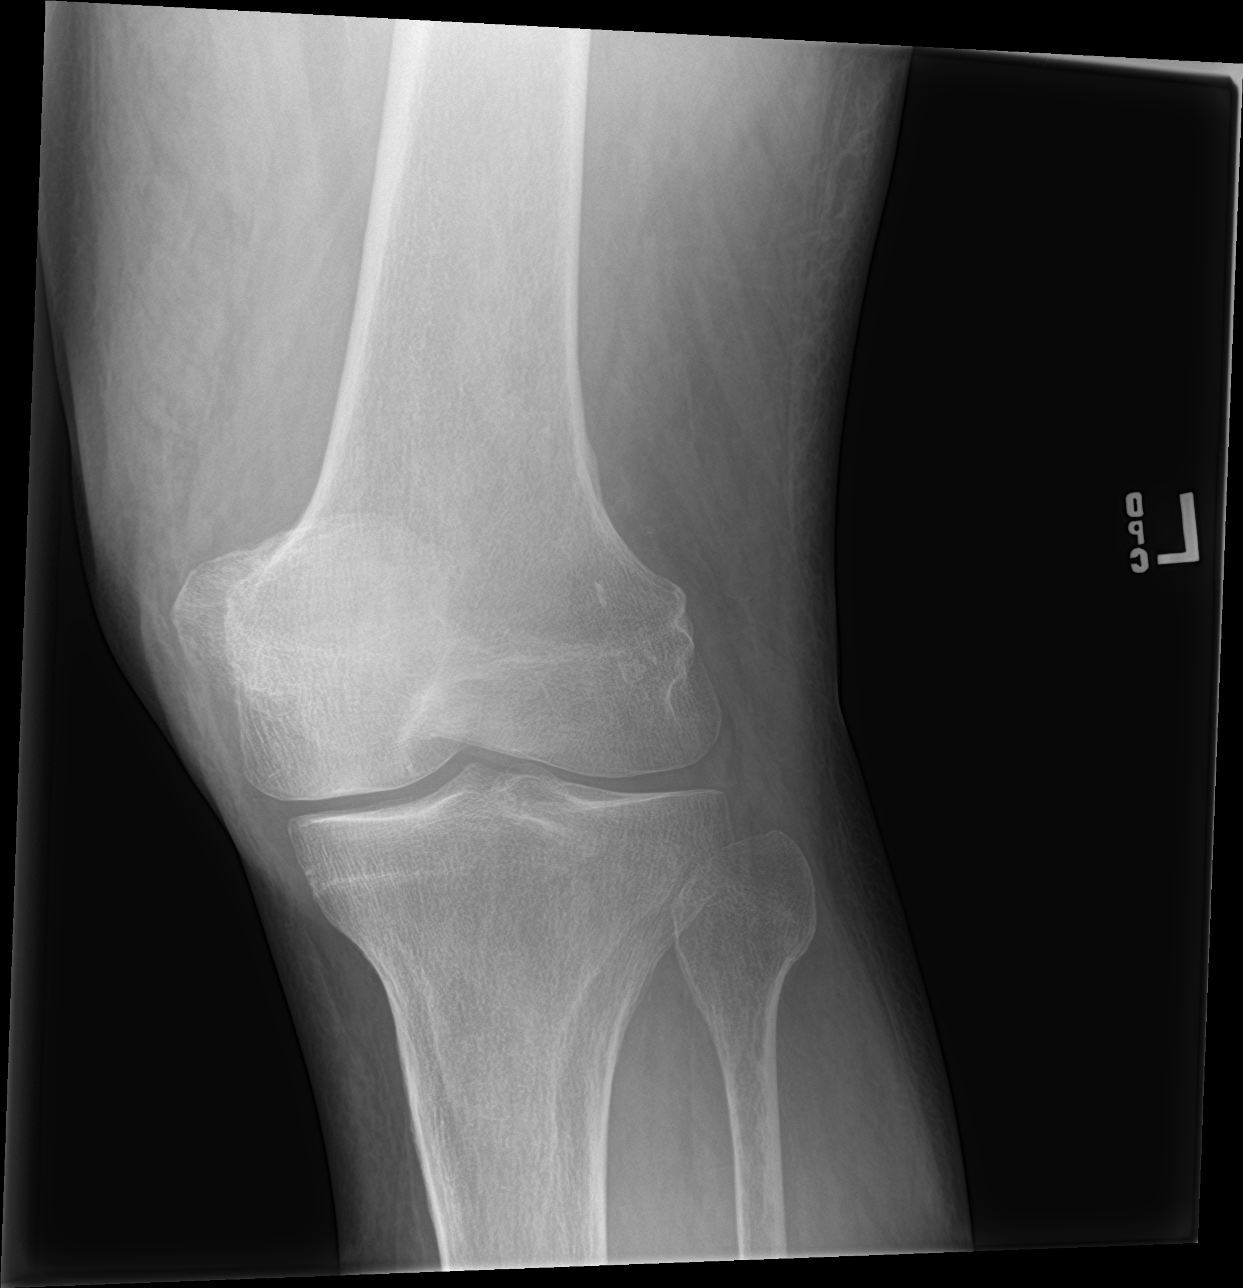

[4 of 4 positions shown; findings below may reference images not displayed]

FINDINGS: No acute fracture dislocation. No joint effusion. Mild degenerative
osteoarthritic changes, most notable at the patellofemoral
articulation. Osteopenia noted. No acute soft tissue abnormality.
Vascular calcifications noted posterior to the knee.
IMPRESSION: 1. No acute osseous abnormality.
2. Mild degenerative osteoarthritic changes about the knee.

## 2024-08-14 DEATH — deceased
